# Patient Record
Sex: Female | Born: 1995
Health system: Southern US, Community
[De-identification: ages and names within clinical notes are randomized; demographics above are authoritative.]

## PROBLEM LIST (undated history)

## (undated) DIAGNOSIS — T7840XA Allergy, unspecified, initial encounter: Secondary | ICD-10-CM

## (undated) DIAGNOSIS — N200 Calculus of kidney: Secondary | ICD-10-CM

## (undated) DIAGNOSIS — Z8489 Family history of other specified conditions: Secondary | ICD-10-CM

## (undated) DIAGNOSIS — N159 Renal tubulo-interstitial disease, unspecified: Secondary | ICD-10-CM

## (undated) DIAGNOSIS — K219 Gastro-esophageal reflux disease without esophagitis: Secondary | ICD-10-CM

## (undated) HISTORY — DX: Allergy, unspecified, initial encounter: T78.40XA

## (undated) HISTORY — PX: TONSILLECTOMY: SUR1361

---

## 2016-10-09 DIAGNOSIS — R000 Tachycardia, unspecified: Secondary | ICD-10-CM | POA: Insufficient documentation

## 2016-10-09 DIAGNOSIS — R079 Chest pain, unspecified: Secondary | ICD-10-CM | POA: Insufficient documentation

## 2016-10-09 DIAGNOSIS — R0789 Other chest pain: Secondary | ICD-10-CM | POA: Insufficient documentation

## 2016-10-13 ENCOUNTER — Ambulatory Visit (INDEPENDENT_AMBULATORY_CARE_PROVIDER_SITE_OTHER): Payer: Managed Care, Other (non HMO)

## 2016-10-13 DIAGNOSIS — R079 Chest pain, unspecified: Secondary | ICD-10-CM

## 2016-10-13 DIAGNOSIS — R000 Tachycardia, unspecified: Secondary | ICD-10-CM

## 2016-11-12 DIAGNOSIS — R009 Unspecified abnormalities of heart beat: Secondary | ICD-10-CM | POA: Insufficient documentation

## 2017-01-30 ENCOUNTER — Encounter: Payer: Self-pay | Admitting: Family Medicine

## 2017-01-30 ENCOUNTER — Ambulatory Visit (INDEPENDENT_AMBULATORY_CARE_PROVIDER_SITE_OTHER): Payer: Managed Care, Other (non HMO) | Admitting: Family Medicine

## 2017-01-30 VITALS — BP 111/68 | HR 97 | Temp 98.2°F | Ht 63.5 in | Wt 157.0 lb

## 2017-01-30 DIAGNOSIS — H65113 Acute and subacute allergic otitis media (mucoid) (sanguinous) (serous), bilateral: Secondary | ICD-10-CM

## 2017-01-30 MED ORDER — FLUTICASONE PROPIONATE 50 MCG/ACT NA SUSP: 1.0000 | Freq: Two times a day (BID) | NASAL | 6 refills | Status: DC | PRN

## 2017-01-30 NOTE — Progress Notes (Signed)
BP 111/68 (BP Location: Left Arm, Patient Position: Sitting, Cuff Size: Normal)   Pulse 97   Temp 98.2 F (36.8 C) (Oral)   Ht 5' 3.5" (1.613 m)   Wt 157 lb (71.2 kg)   LMP 01/27/2017   BMI 27.38 kg/m    Subjective:    Patient ID: Jeanne Frederick, female    DOB: [birth year], 21 y.o.   MRN: [id number]  HPI: Jeanne Frederick is a 21 y.o. female presenting on 01/30/2017 for Establish Care and Otalgia (started yesterday, bilateral)   HPI Patient is coming in to establish care and for ear pain Patient has bilateral ear pain is worse on the left than the right. She has been having this ear pain for the last day. She has had this previously when it gets really bad and it gets infected and she is trying to catch it before it becomes infected this time. She does admit that she gets seasonal allergies especially to grass and thinks that may be starting up. She has not used anything for it yet. She denies any fevers or chills or cough or congestion. She does have some nasal congestion and drainage but it is minimal at this point. She denies any sick contacts that she knows of.  Relevant past medical, surgical, family and social history reviewed and updated as indicated. Interim medical history since our last visit reviewed. Allergies and medications reviewed and updated.  Review of Systems  Constitutional: Negative for chills and fever.  HENT: Positive for congestion and ear pain. Negative for ear discharge, rhinorrhea, sinus pain, sinus pressure, sneezing and sore throat.   Eyes: Negative for redness and visual disturbance.  Respiratory: Negative for cough, chest tightness and shortness of breath.   Cardiovascular: Negative for chest pain and leg swelling.  Musculoskeletal: Negative for back pain and gait problem.  Skin: Negative for rash.  Neurological: Negative for light-headedness and headaches.  All other systems reviewed and are negative.   Per HPI unless specifically indicated  above  Social History   Social History  . Marital status: Married    Spouse name: N/A  . Number of children: N/A  . Years of education: N/A   Occupational History  . Not on file.   Social History Main Topics  . Smoking status: Never Smoker  . Smokeless tobacco: Never Used  . Alcohol use No  . Drug use: No  . Sexual activity: Yes     Comment: married 06/2016   Other Topics Concern  . Not on file   Social History Narrative  . No narrative on file    History reviewed. No pertinent surgical history.  Family History  Problem Relation Age of Onset  . Diabetes Maternal Grandmother     Allergies as of 01/30/2017   No Known Allergies     Medication List       Accurate as of 01/30/17  3:54 PM. Always use your most recent med list.          CRYSELLE-28 0.3-30 MG-MCG tablet Generic drug:  norgestrel-ethinyl estradiol   fluticasone 50 MCG/ACT nasal spray Commonly known as:  FLONASE Place 1 spray into both nostrils 2 (two) times daily as needed for allergies or rhinitis.          Objective:    BP 111/68 (BP Location: Left Arm, Patient Position: Sitting, Cuff Size: Normal)   Pulse 97   Temp 98.2 F (36.8 C) (Oral)   Ht 5' 3.5" (1.613 m)   Wt 157  lb (71.2 kg)   LMP 01/27/2017   BMI 27.38 kg/m   Wt Readings from Last 3 Encounters:  01/30/17 157 lb (71.2 kg)    Physical Exam  Constitutional: She is oriented to person, place, and time. She appears well-developed and well-nourished. No distress.  HENT:  Right Ear: External ear and ear canal normal. No mastoid tenderness. Tympanic membrane is bulging. Tympanic membrane is not injected, not scarred, not perforated and not erythematous.  Left Ear: External ear and ear canal normal. No mastoid tenderness. Tympanic membrane is bulging. Tympanic membrane is not injected, not scarred, not perforated and not erythematous.  Nose: Mucosal edema and rhinorrhea present. No epistaxis. Right sinus exhibits no maxillary sinus  tenderness and no frontal sinus tenderness. Left sinus exhibits no maxillary sinus tenderness and no frontal sinus tenderness.  Mouth/Throat: Uvula is midline and mucous membranes are normal. No oropharyngeal exudate, posterior oropharyngeal edema, posterior oropharyngeal erythema or tonsillar abscesses.  Eyes: Conjunctivae and EOM are normal.  Cardiovascular: Normal rate, regular rhythm, normal heart sounds and intact distal pulses.   No murmur heard. Pulmonary/Chest: Effort normal and breath sounds normal. No respiratory distress. She has no wheezes.  Musculoskeletal: Normal range of motion. She exhibits no edema or tenderness.  Neurological: She is alert and oriented to person, place, and time. Coordination normal.  Skin: Skin is warm and dry. No rash noted. She is not diaphoretic.  Psychiatric: She has a normal mood and affect. Her behavior is normal.  Nursing note and vitals reviewed.   No results found for this or any previous visit.    Assessment & Plan:   Problem List Items Addressed This Visit    None    Visit Diagnoses    Acute allergic otitis media of both ears, recurrence not specified    -  Primary   Relevant Medications   fluticasone (FLONASE) 50 MCG/ACT nasal spray       Follow up plan: Return if symptoms worsen or fail to improve.  Caryl Pina, MD [facility] 01/30/2017, 3:54 PM

## 2017-05-27 ENCOUNTER — Telehealth: Payer: Self-pay | Admitting: Family Medicine

## 2017-05-27 NOTE — Telephone Encounter (Signed)
No shot records in [place] and none in [place]. Will check with Raynelle Highland to see if she has paper chart

## 2017-05-27 NOTE — Telephone Encounter (Signed)
Called pt to let her know that we do not have shot records and she is going to call dr where she had her shot records.

## 2017-09-08 DIAGNOSIS — H43392 Other vitreous opacities, left eye: Secondary | ICD-10-CM | POA: Diagnosis not present

## 2017-09-08 DIAGNOSIS — H35062 Retinal vasculitis, left eye: Secondary | ICD-10-CM | POA: Diagnosis not present

## 2017-09-24 ENCOUNTER — Encounter: Payer: Self-pay | Admitting: Family Medicine

## 2017-09-24 ENCOUNTER — Ambulatory Visit: Payer: BLUE CROSS/BLUE SHIELD | Admitting: Family Medicine

## 2017-09-24 VITALS — BP 125/76 | HR 113 | Temp 97.8°F | Ht 63.5 in | Wt 160.0 lb

## 2017-09-24 DIAGNOSIS — M779 Enthesopathy, unspecified: Principal | ICD-10-CM

## 2017-09-24 DIAGNOSIS — T782XXA Anaphylactic shock, unspecified, initial encounter: Secondary | ICD-10-CM | POA: Diagnosis not present

## 2017-09-24 DIAGNOSIS — M778 Other enthesopathies, not elsewhere classified: Secondary | ICD-10-CM | POA: Diagnosis not present

## 2017-09-24 NOTE — Progress Notes (Signed)
BP 125/76   Pulse (!) 113   Temp 97.8 F (36.6 C) (Oral)   Ht 5' 3.5" (1.613 m)   Wt 160 lb (72.6 kg)   BMI 27.90 kg/m    Subjective:    Patient ID: Jeanne Frederick, female    DOB: 1996-03-09, 21 y.o.   MRN: [id number]  HPI: Jeanne Frederick is a 21 y.o. female presenting on 09/24/2017 for Right hand pain (hurts in wrist area and on top of hand when lifting; no known injury) and Lips feel as if they are swelling after having dairy product (Alpha Gal Allergy)   HPI Right hand pain Patient is coming in with right hand pain that hurts on the back of her right hand that extends up to inside of her middle finger that has been going on for about a week.  She says it hurts more when she lifts things with that hand or if she extends the hand.  She denies any numbness or weakness with it.  She denies any inciting trauma or event.  She just says it caused a lot of pain and irritation.  She denies any redness or warmth or swelling that she is noticed.  Swelling of lips and mouth and anaphylaxis Patient had an episode a couple days ago of swelling of her lips and mouth after drinking milk and she is concerned about whether she may have developed a milk allergy.  Patient has known alpha galactose allergy due to a tick bite.  She also had some hives around her face and mouth when this came.  She took some Benadryl.  She does have an EpiPen that she carries but just in case because she has had severe reactions to the alpha gal before.  She denies any current symptoms today.  Relevant past medical, surgical, family and social history reviewed and updated as indicated. Interim medical history since our last visit reviewed. Allergies and medications reviewed and updated.  Review of Systems  Constitutional: Negative for chills and fever.  HENT: Negative for congestion, ear discharge and ear pain.   Eyes: Negative for redness and visual disturbance.  Respiratory: Negative for chest tightness and  shortness of breath.   Cardiovascular: Negative for chest pain and leg swelling.  Musculoskeletal: Positive for arthralgias. Negative for back pain, gait problem and joint swelling.  Skin: Negative for color change and rash.  Neurological: Negative for light-headedness and headaches.  Psychiatric/Behavioral: Negative for agitation and behavioral problems.  All other systems reviewed and are negative.   Per HPI unless specifically indicated above   Allergies as of 09/24/2017      Reactions   Beef-derived Products    Alpha Gal Allergy   Pork-derived Products    Alpha Gal Allergy      Medication List        Accurate as of 09/24/17  3:31 PM. Always use your most recent med list.          CRYSELLE-28 0.3-30 MG-MCG tablet Generic drug:  norgestrel-ethinyl estradiol          Objective:    BP 125/76   Pulse (!) 113   Temp 97.8 F (36.6 C) (Oral)   Ht 5' 3.5" (1.613 m)   Wt 160 lb (72.6 kg)   BMI 27.90 kg/m   Wt Readings from Last 3 Encounters:  09/24/17 160 lb (72.6 kg)  01/30/17 157 lb (71.2 kg)    Physical Exam  Constitutional: She is oriented to person, place, and time. She appears  well-developed and well-nourished. No distress.  Eyes: Conjunctivae are normal.  Neck: Neck supple. No thyromegaly present.  Cardiovascular: Normal rate, regular rhythm, normal heart sounds and intact distal pulses.  No murmur heard. Pulmonary/Chest: Effort normal and breath sounds normal. No respiratory distress. She has no wheezes.  Musculoskeletal: Normal range of motion. She exhibits no edema.       Right hand: She exhibits tenderness. She exhibits normal range of motion and normal capillary refill.       Hands: Lymphadenopathy:    She has no cervical adenopathy.  Neurological: She is alert and oriented to person, place, and time. Coordination normal.  Skin: Skin is warm and dry. No rash noted. She is not diaphoretic.  Psychiatric: She has a normal mood and affect. Her behavior  is normal.  Nursing note and vitals reviewed.   No results found for this or any previous visit.    Assessment & Plan:   Problem List Items Addressed This Visit    None    Visit Diagnoses    Tendinitis of extensor tendon of right hand    -  Primary   Recommended Aleve twice a day for 7 days and then return or call back if not improved   Anaphylaxis, initial encounter       Patient had an episode of anaphylaxis to milk and wants to be tested for allergy testing.  She has known alpha gal but is concerned about the milk   Relevant Orders   Allergen Profile, Basic Food       Follow up plan: Return if symptoms worsen or fail to improve.  Counseling provided for all of the vaccine components Orders Placed This Encounter  Procedures  . Allergen Profile, [place]er, MD [facility] 09/24/2017, 3:31 PM

## 2017-09-28 LAB — ALLERGEN PROFILE, BASIC FOOD
Allergen Corn, IgE: <0.1 kU/L
Beef IgE: 1.04 kU/L — AB
Chocolate/Cacao IgE: <0.1 kU/L
Egg, Whole IgE: <0.1 kU/L
Food Mix (Seafoods) IgE: NEGATIVE
Milk IgE: 0.26 kU/L — AB
Peanut IgE: <0.1 kU/L
Pork IgE: 0.58 kU/L — AB
Soybean IgE: <0.1 kU/L
Wheat IgE: <0.1 kU/L

## 2017-11-23 ENCOUNTER — Encounter: Payer: Self-pay | Admitting: Family Medicine

## 2017-11-23 ENCOUNTER — Ambulatory Visit: Payer: BLUE CROSS/BLUE SHIELD | Admitting: Family Medicine

## 2017-11-23 VITALS — BP 126/77 | HR 97 | Temp 97.9°F | Ht 63.5 in | Wt 163.4 lb

## 2017-11-23 DIAGNOSIS — J069 Acute upper respiratory infection, unspecified: Secondary | ICD-10-CM | POA: Diagnosis not present

## 2017-11-23 LAB — VERITOR FLU A/B WAIVED
Influenza A: NEGATIVE
Influenza B: NEGATIVE

## 2017-11-23 NOTE — Progress Notes (Signed)
BP 126/77   Pulse 97   Temp 97.9 F (36.6 C) (Oral)   Ht 5' 3.5" (1.613 m)   Wt 163 lb 6.4 oz (74.1 kg)   BMI 28.49 kg/m    Subjective:    Patient ID: Jeanne Frederick, female    DOB: [birth year], 22 y.o.   MRN: [id number]  HPI: Jeanne Frederick is a 22 y.o. female presenting on 11/23/2017 for Cough (x 2 days); Nasal Congestion; Sore Throat; and Generalized Body Aches   HPI Sore throat nasal congestion and body aches Patient comes in today complaining of sore throat and nasal congestion and body aches is been going on for the past 2 days.  She had had a cough previous to that for the past week but it really started up 2 days ago.  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  She has had a productive cough and a sore throat as well.  Relevant past medical, surgical, family and social history reviewed and updated as indicated. Interim medical history since our last visit reviewed. Allergies and medications reviewed and updated.  Review of Systems  Constitutional: Positive for chills and fever.  HENT: Positive for congestion, postnasal drip, rhinorrhea, sinus pressure, sneezing and sore throat. Negative for ear discharge and ear pain.   Eyes: Negative for pain, redness and visual disturbance.  Respiratory: Positive for cough. Negative for chest tightness, shortness of breath and wheezing.   Cardiovascular: Negative for chest pain and leg swelling.  Genitourinary: Negative for difficulty urinating and dysuria.  Musculoskeletal: Positive for myalgias. Negative for back pain and gait problem.  Skin: Negative for rash.  Neurological: Negative for light-headedness and headaches.  Psychiatric/Behavioral: Negative for agitation and behavioral problems.  All other systems reviewed and are negative.   Per HPI  unless specifically indicated above   Allergies as of 11/23/2017      Reactions   Beef-derived Products    Alpha Gal Allergy   Pork-derived Products    Alpha Gal Allergy      Medication List        Accurate as of 11/23/17  2:31 PM. Always use your most recent med list.          CRYSELLE-28 0.3-30 MG-MCG tablet Generic drug:  norgestrel-ethinyl estradiol          Objective:    BP 126/77   Pulse 97   Temp 97.9 F (36.6 C) (Oral)   Ht 5' 3.5" (1.613 m)   Wt 163 lb 6.4 oz (74.1 kg)   BMI 28.49 kg/m   Wt Readings from Last 3 Encounters:  11/23/17 163 lb 6.4 oz (74.1 kg)  09/24/17 160 lb (72.6 kg)  01/30/17 157 lb (71.2 kg)    Physical Exam  Constitutional: She is oriented to person, place, and time. She appears well-developed and well-nourished. No distress.  HENT:  Right Ear: Tympanic membrane, external ear and ear canal normal.  Left Ear: Tympanic membrane, external ear and ear canal normal.  Nose: Mucosal edema and rhinorrhea present. No epistaxis. Right sinus exhibits no maxillary sinus tenderness and no frontal sinus tenderness. Left sinus exhibits no maxillary sinus tenderness and no frontal sinus tenderness.  Mouth/Throat: Uvula is midline and mucous membranes are normal. Posterior oropharyngeal edema and posterior oropharyngeal erythema present. No  oropharyngeal exudate or tonsillar abscesses.  Eyes: Conjunctivae are normal.  Cardiovascular: Normal rate, regular rhythm, normal heart sounds and intact distal pulses.  No murmur heard. Pulmonary/Chest: Effort normal and breath sounds normal. No respiratory distress. She has no wheezes. She has no rales.  Musculoskeletal: Normal range of motion. She exhibits no edema or tenderness.  Neurological: She is alert and oriented to person, place, and time. Coordination normal.  Skin: Skin is warm and dry. No rash noted. She is not diaphoretic.  Psychiatric: She has a normal mood and affect. Her behavior is normal.    Nursing note and vitals reviewed.       Assessment & Plan:   Problem List Items Addressed This Visit    None    Visit Diagnoses    Viral upper respiratory infection    -  Primary   Recommended Flonase and Mucinex and nasal saline and ibuprofen   Relevant Orders   Veritor Flu A/B Waived     Sounds like patient is on the improvement side as she has not had fevers and at least 24 hours.  Recommend conservative management and if worsens she will give [place] call back.  Follow up plan: Return if symptoms worsen or fail to improve.  Counseling provided for all of the vaccine components Orders Placed This Encounter  Procedures  . Veritor Flu A/B Ulyses Southward, MD [facility] 11/23/2017, 2:31 PM

## 2017-12-11 DIAGNOSIS — Z6828 Body mass index (BMI) 28.0-28.9, adult: Secondary | ICD-10-CM | POA: Diagnosis not present

## 2017-12-11 DIAGNOSIS — Z01419 Encounter for gynecological examination (general) (routine) without abnormal findings: Secondary | ICD-10-CM | POA: Diagnosis not present

## 2017-12-28 DIAGNOSIS — J3501 Chronic tonsillitis: Secondary | ICD-10-CM | POA: Insufficient documentation

## 2017-12-28 DIAGNOSIS — J302 Other seasonal allergic rhinitis: Secondary | ICD-10-CM | POA: Insufficient documentation

## 2018-01-04 DIAGNOSIS — N941 Unspecified dyspareunia: Secondary | ICD-10-CM | POA: Diagnosis not present

## 2018-01-04 DIAGNOSIS — Z6829 Body mass index (BMI) 29.0-29.9, adult: Secondary | ICD-10-CM | POA: Diagnosis not present

## 2018-01-04 DIAGNOSIS — R102 Pelvic and perineal pain: Secondary | ICD-10-CM | POA: Diagnosis not present

## 2018-02-21 ENCOUNTER — Emergency Department (HOSPITAL_COMMUNITY): Payer: BLUE CROSS/BLUE SHIELD

## 2018-02-21 ENCOUNTER — Encounter (HOSPITAL_COMMUNITY): Payer: Self-pay | Admitting: Emergency Medicine

## 2018-02-21 ENCOUNTER — Other Ambulatory Visit: Payer: Self-pay

## 2018-02-21 ENCOUNTER — Inpatient Hospital Stay (HOSPITAL_COMMUNITY)
Admission: EM | Admit: 2018-02-21 | Discharge: 2018-02-23 | DRG: 694 | Disposition: A | Discharge status: Home / Self Care | Payer: BLUE CROSS/BLUE SHIELD | Attending: Internal Medicine | Admitting: Internal Medicine

## 2018-02-21 DIAGNOSIS — R1031 Right lower quadrant pain: Secondary | ICD-10-CM | POA: Diagnosis not present

## 2018-02-21 DIAGNOSIS — R1084 Generalized abdominal pain: Secondary | ICD-10-CM | POA: Diagnosis not present

## 2018-02-21 DIAGNOSIS — N202 Calculus of kidney with calculus of ureter: Secondary | ICD-10-CM | POA: Diagnosis not present

## 2018-02-21 DIAGNOSIS — E876 Hypokalemia: Secondary | ICD-10-CM

## 2018-02-21 DIAGNOSIS — Z91018 Allergy to other foods: Secondary | ICD-10-CM | POA: Diagnosis not present

## 2018-02-21 DIAGNOSIS — R1110 Vomiting, unspecified: Secondary | ICD-10-CM

## 2018-02-21 DIAGNOSIS — Z79899 Other long term (current) drug therapy: Secondary | ICD-10-CM

## 2018-02-21 DIAGNOSIS — N200 Calculus of kidney: Secondary | ICD-10-CM | POA: Diagnosis not present

## 2018-02-21 DIAGNOSIS — A419 Sepsis, unspecified organism: Secondary | ICD-10-CM | POA: Diagnosis present

## 2018-02-21 DIAGNOSIS — R112 Nausea with vomiting, unspecified: Secondary | ICD-10-CM | POA: Diagnosis not present

## 2018-02-21 DIAGNOSIS — R109 Unspecified abdominal pain: Secondary | ICD-10-CM | POA: Diagnosis not present

## 2018-02-21 DIAGNOSIS — N10 Acute tubulo-interstitial nephritis: Secondary | ICD-10-CM | POA: Diagnosis not present

## 2018-02-21 DIAGNOSIS — N201 Calculus of ureter: Secondary | ICD-10-CM | POA: Diagnosis not present

## 2018-02-21 DIAGNOSIS — R000 Tachycardia, unspecified: Secondary | ICD-10-CM | POA: Diagnosis not present

## 2018-02-21 LAB — COMPREHENSIVE METABOLIC PANEL
ALT: 16 U/L (ref 14–54)
AST: 22 U/L (ref 15–41)
Albumin: 4.1 g/dL (ref 3.5–5.0)
Alkaline Phosphatase: 39 U/L (ref 38–126)
Anion gap: 11 (ref 5–15)
BUN: 11 mg/dL (ref 6–20)
CO2: 22 mmol/L (ref 22–32)
Calcium: 9.2 mg/dL (ref 8.9–10.3)
Chloride: 112 mmol/L — ABNORMAL HIGH (ref 101–111)
Creatinine, Ser: 0.63 mg/dL (ref 0.44–1.00)
GFR calc Af Amer: >60 mL/min (ref >60)
GFR calc non Af Amer: >60 mL/min (ref >60)
Glucose, Bld: 96 mg/dL (ref 65–99)
Potassium: 3.5 mmol/L (ref 3.5–5.1)
Sodium: 145 mmol/L (ref 135–145)
Total Bilirubin: 0.7 mg/dL (ref 0.3–1.2)
Total Protein: 7.1 g/dL (ref 6.5–8.1)

## 2018-02-21 LAB — WET PREP, GENITAL
Clue Cells Wet Prep HPF POC: NONE SEEN
Sperm: NONE SEEN
Trich, Wet Prep: NONE SEEN
Yeast Wet Prep HPF POC: NONE SEEN

## 2018-02-21 LAB — CBC WITH DIFFERENTIAL/PLATELET
Basophils Absolute: 0 10*3/uL (ref 0.0–0.1)
Basophils Relative: 0 %
Eosinophils Absolute: 0 10*3/uL (ref 0.0–0.7)
Eosinophils Relative: 0 %
HCT: 35.7 % — ABNORMAL LOW (ref 36.0–46.0)
Hemoglobin: 12 g/dL (ref 12.0–15.0)
Lymphocytes Relative: 9 %
Lymphs Abs: 1.4 10*3/uL (ref 0.7–4.0)
MCH: 31.7 pg (ref 26.0–34.0)
MCHC: 33.6 g/dL (ref 30.0–36.0)
MCV: 94.4 fL (ref 78.0–100.0)
Monocytes Absolute: 0.6 10*3/uL (ref 0.1–1.0)
Monocytes Relative: 4 %
Neutro Abs: 12.9 10*3/uL — ABNORMAL HIGH (ref 1.7–7.7)
Neutrophils Relative %: 87 %
Platelets: 242 10*3/uL (ref 150–400)
RBC: 3.78 MIL/uL — ABNORMAL LOW (ref 3.87–5.11)
RDW: 12.5 % (ref 11.5–15.5)
WBC: 14.8 10*3/uL — ABNORMAL HIGH (ref 4.0–10.5)

## 2018-02-21 LAB — URINALYSIS, ROUTINE W REFLEX MICROSCOPIC
Bilirubin Urine: NEGATIVE
Glucose, UA: NEGATIVE mg/dL
Ketones, ur: 80 mg/dL — AB
Leukocytes, UA: NEGATIVE
Nitrite: NEGATIVE
Protein, ur: 100 mg/dL — AB
RBC / HPF: >50 RBC/hpf — ABNORMAL HIGH (ref 0–5)
Specific Gravity, Urine: 1.021 (ref 1.005–1.030)
pH: 8 (ref 5.0–8.0)

## 2018-02-21 LAB — I-STAT CG4 LACTIC ACID, ED: Lactic Acid, Venous: 0.99 mmol/L (ref 0.5–1.9)

## 2018-02-21 LAB — I-STAT BETA HCG BLOOD, ED (MC, WL, AP ONLY): I-stat hCG, quantitative: <5 m[IU]/mL (ref <5)

## 2018-02-21 LAB — LIPASE, BLOOD: Lipase: 24 U/L (ref 11–51)

## 2018-02-21 MED ORDER — ONDANSETRON HCL 4 MG PO TABS: 4.0000 mg | ORAL_TABLET | Freq: Four times a day (QID) | ORAL | Status: DC | PRN

## 2018-02-21 MED ORDER — MORPHINE SULFATE (PF) 4 MG/ML IV SOLN
4.0000 mg | Freq: Once | INTRAVENOUS | Status: AC
  Administered 2018-02-21: 4 mg via INTRAVENOUS
  Filled 2018-02-21: qty 1

## 2018-02-21 MED ORDER — DEXTROSE-NACL 5-0.45 % IV SOLN
INTRAVENOUS | Status: DC
  Administered 2018-02-21 – 2018-02-22 (×2): via INTRAVENOUS

## 2018-02-21 MED ORDER — MORPHINE SULFATE (PF) 4 MG/ML IV SOLN: 2.0000 mg | INTRAVENOUS | Status: DC | PRN

## 2018-02-21 MED ORDER — SODIUM CHLORIDE 0.9 % IV BOLUS
1000.0000 mL | Freq: Once | INTRAVENOUS | Status: AC
  Administered 2018-02-21: 1000 mL via INTRAVENOUS

## 2018-02-21 MED ORDER — IOPAMIDOL (ISOVUE-300) INJECTION 61%
100.0000 mL | Freq: Once | INTRAVENOUS | Status: AC | PRN
  Administered 2018-02-21: 100 mL via INTRAVENOUS

## 2018-02-21 MED ORDER — IOPAMIDOL (ISOVUE-300) INJECTION 61%
INTRAVENOUS | Status: AC
  Filled 2018-02-21: qty 100

## 2018-02-21 MED ORDER — ONDANSETRON HCL 4 MG/2ML IJ SOLN
4.0000 mg | Freq: Once | INTRAMUSCULAR | Status: AC
  Administered 2018-02-21: 4 mg via INTRAVENOUS
  Filled 2018-02-21: qty 2

## 2018-02-21 MED ORDER — FENTANYL CITRATE (PF) 100 MCG/2ML IJ SOLN
25.0000 ug | Freq: Once | INTRAMUSCULAR | Status: AC
  Administered 2018-02-21: 25 ug via INTRAVENOUS
  Filled 2018-02-21: qty 2

## 2018-02-21 MED ORDER — ACETAMINOPHEN 325 MG PO TABS
650.0000 mg | ORAL_TABLET | Freq: Four times a day (QID) | ORAL | Status: DC | PRN
  Administered 2018-02-21 – 2018-02-23 (×2): 650 mg via ORAL
  Filled 2018-02-21 (×2): qty 2

## 2018-02-21 MED ORDER — SODIUM CHLORIDE 0.9 % IV SOLN
2.0000 g | INTRAVENOUS | Status: DC
  Administered 2018-02-21: 2 g via INTRAVENOUS
  Filled 2018-02-21: qty 20

## 2018-02-21 MED ORDER — ACETAMINOPHEN 650 MG RE SUPP: 650.0000 mg | Freq: Four times a day (QID) | RECTAL | Status: DC | PRN

## 2018-02-21 MED ORDER — SODIUM CHLORIDE 0.9 % IV BOLUS (SEPSIS)
1500.0000 mL | Freq: Once | INTRAVENOUS | Status: AC
  Administered 2018-02-21: 1500 mL via INTRAVENOUS

## 2018-02-21 MED ORDER — ONDANSETRON HCL 4 MG/2ML IJ SOLN
4.0000 mg | Freq: Four times a day (QID) | INTRAMUSCULAR | Status: DC | PRN
  Administered 2018-02-21 – 2018-02-22 (×2): 4 mg via INTRAVENOUS
  Filled 2018-02-21 (×2): qty 2

## 2018-02-21 MED ORDER — MORPHINE SULFATE (PF) 2 MG/ML IV SOLN: 2.0000 mg | INTRAVENOUS | Status: DC | PRN

## 2018-02-21 NOTE — ED Notes (Signed)
Pt vomited before vitals.

## 2018-02-21 NOTE — ED Notes (Signed)
Bed: WA03 Expected date:  Expected time:  Means of arrival:  Comments: T3

## 2018-02-21 NOTE — ED Notes (Signed)
Pelvic cart at bedisde

## 2018-02-21 NOTE — Consult Note (Signed)
Urology Consult Note   Requesting Attending Physician:  Etta Quill, DO Service Providing Consult: Urology  Consulting Attending: Lovena Neighbours   Reason for Consult:  Right flank pain  HPI: Jeanne Frederick is seen in consultation for reasons noted above at the request of Etta Quill, DO for evaluation of Right flank pain.  This is a 22 y.o. female with right flank pain.   Started to have abdm discomfort and cramping yesterday. Pain became worse today and localized to the right side. She had associated nausea and vomiting x 4. No prior hx of kidney stones.  In the ED, Afebrile and tachy to the low 100s WBC - 14.8 Cr 0.63 U/A - Negative LE, nitrites, rare bacteria, >50 rbc, 0-5 WBC  CT showed mildly delayed right nephrogram with 33mm calcification in the region of the mid ureter,  stone vs phlebolith   Past Medical History: Past Medical History:  Diagnosis Date  . Allergy     Past Surgical History:  History reviewed. No pertinent surgical history.  Medication: Current Facility-Administered Medications  Medication Dose Route Frequency Provider Last Rate Last Dose  . acetaminophen (TYLENOL) tablet 650 mg  650 mg Oral Q6H PRN Etta Quill, DO       Or  . acetaminophen (TYLENOL) suppository 650 mg  650 mg Rectal Q6H PRN Etta Quill, DO      . cefTRIAXone (ROCEPHIN) 2 g in sodium chloride 0.9 % 100 mL IVPB  2 g Intravenous Q24H Lorin Glass, PA-C   Stopped at 02/21/18 2016  . dextrose 5 %-0.45 % sodium chloride infusion   Intravenous Continuous Etta Quill, DO 100 mL/hr at 02/21/18 1949    . iopamidol (ISOVUE-300) 61 % injection        Stopped at 02/21/18 1806  . morphine 2 MG/ML injection 2-4 mg  2-4 mg Intravenous Q4H PRN Etta Quill, DO      . ondansetron [facility]) tablet 4 mg  4 mg Oral Q6H PRN Etta Quill, DO       Or  . ondansetron [facility]) injection 4 mg  4 mg Intravenous Q6H PRN Etta Quill, DO       Current Outpatient Medications   Medication Sig Dispense Refill  . norethindrone-ethinyl estradiol (JUNEL FE,GILDESS FE,LOESTRIN FE) 1-20 MG-MCG tablet Take 1 tablet by mouth daily.    Marland Kitchen EPINEPHrine (EPIPEN 2-PAK) 0.3 mg/0.3 mL IJ SOAJ injection Inject 1 pen as directed once as needed for anaphylaxis.      Allergies: Allergies  Allergen Reactions  . Lac Bovis Swelling and Other (See Comments)  . Beef-Derived Products     Alpha Gal Allergy   . Other Other (See Comments)    Cannot take capsules of any kind d/t alpha gal disease  . Pork-Derived Risk manager Gal Allergy    Social History: Social History   Tobacco Use  . Smoking status: Never Smoker  . Smokeless tobacco: Never Used  Substance Use Topics  . Alcohol use: No  . Drug use: No    Family History Family History  Problem Relation Age of Onset  . Diabetes Maternal Grandmother     Review of Systems 10 systems were reviewed and are negative except as noted specifically in the HPI.  Objective   Vital signs in last 24 hours: BP 130/87   Pulse (!) 118   Temp 98.4 F (36.9 C) (Oral)   Resp (!) 26   Ht 5\' 3"  (1.6 m)  Wt 70.3 kg (155 lb)   SpO2 98%   BMI 27.46 kg/m   Physical Exam General: NAD, A&O, resting, appropriate HEENT: [place]/AT, EOMI, MMM Pulmonary: Normal work of breathing Cardiovascular: HDS, adequate peripheral perfusion Abdomen: Soft, NTTP, nondistended, . GU: right CVA tenderness Extremities: warm and well perfused Neuro: Appropriate, no focal neurological deficits  Most Recent Labs: Lab Results  Component Value Date   WBC 14.8 (H) 02/21/2018   HGB 12.0 02/21/2018   HCT 35.7 (L) 02/21/2018   PLT 242 02/21/2018    Lab Results  Component Value Date   NA 145 02/21/2018   K 3.5 02/21/2018   CL 112 (H) 02/21/2018   CO2 22 02/21/2018   BUN 11 02/21/2018   CREATININE 0.63 02/21/2018   CALCIUM 9.2 02/21/2018    No results found for: INR, APTT   IMAGING: Ct Abdomen Pelvis W Contrast  Result Date:  02/21/2018 CLINICAL DATA:  Abdominal discomfort since this morning. Intermittent cramping after changing birth control pills 2 months ago. Rectal pain radiating to the back. EXAM: CT ABDOMEN AND PELVIS WITH CONTRAST TECHNIQUE: Multidetector CT imaging of the abdomen and pelvis was performed using the standard protocol following bolus administration of intravenous contrast. CONTRAST:  138mL ISOVUE-300 IOPAMIDOL (ISOVUE-300) INJECTION 61% COMPARISON:  None. FINDINGS: LOWER CHEST: Mild dependent atelectasis clear. Included heart size is normal. No pericardial effusion. HEPATOBILIARY: Liver and gallbladder are normal. PANCREAS: Normal. SPLEEN: Normal. ADRENALS/URINARY TRACT: Kidneys are orthotopic. Mildly delayed and striated RIGHT nephrogram with RIGHT urothelial enhancement. RIGHT pelviectasis. No hydronephrosis. 2 mm calculus within or adjacent to distal RIGHT ureter. Urinary bladder is partially distended, unremarkable. Punctate LEFT lower pole nephrolithiasis. STOMACH/BOWEL: The stomach, small and large bowel are normal in course and caliber without inflammatory changes. Normal appendix. VASCULAR/LYMPHATIC: Aortoiliac vessels are normal in course and caliber. No lymphadenopathy by CT size criteria. REPRODUCTIVE: Normal. OTHER: No intraperitoneal free fluid or free air. MUSCULOSKELETAL: Nonacute.  Small fat containing umbilical hernia. IMPRESSION: 1. RIGHT pyelonephritis. 2 mm distal RIGHT ureteral calculus versus phlebolith. RIGHT pelviectasis. 2. Punctate nonobstructing LEFT lower pole nephrolithiasis. Electronically Signed   By: Elon Alas M.D.   On: 02/21/2018 18:44    ------  Assessment:  22 y.o. female with right flank pain and tachycardia. CT with 1mm calcification in the mid right ureter, concerning for stone vs phlebolith.   Difficult to determine whether calcification is in the ureter or represents a phlebolith. Coronal views are suspicious for a ureteral stone; however, in axial views this  is difficult to confirm. At this point we will assume the calcification is in the ureter.   The patient is afebrile and her vitals are stable. U/a is relatively benign. Recommend abx and close monitoring. If patient develops a fever she will require right ureteral stent placement. Otherwise, we will follow up with her as an outpatient in 2 weeks with a renal ultrasound.   Recommendations: - Start flomax 0.4mg   - Follow up urine culture - If patient develops fever, please contact urology for stent placement  - Urology will coordinate outpatient follow up in 2 weeks w/ RUS prior.                                                    Thank you for this consult. Please contact the urology consult pager with any further questions/concerns.

## 2018-02-21 NOTE — ED Triage Notes (Signed)
Per EMS, from work, c/o lower abdominal discomfort since waking this morning. Reports ongoing intermittent lower abdominal cramping since birth control pills were changed two months ago. Denies urinary sx. N/V with EMS.   22g L hand 50 mcg Fentanyl with EMS

## 2018-02-21 NOTE — ED Notes (Signed)
Urine culture sent with sample. 

## 2018-02-21 NOTE — ED Notes (Signed)
ED TO INPATIENT HANDOFF REPORT  Name/Age/Gender Jeanne Frederick 22 y.o. female  Code Status    Code Status Orders  (From admission, onward)        Start     Ordered   02/21/18 1953  Full code  Continuous     02/21/18 1956    Code Status History    This patient has a current code status but no historical code status.      Home/SNF/Other Home  Chief Complaint abd pain  Level of Care/Admitting Diagnosis ED Disposition    ED Disposition Condition [facility] Area: [place] [100102]  Level of Care: Telemetry [5]  Admit to tele based on following criteria: Complex arrhythmia (Bradycardia/Tachycardia)  Diagnosis: Acute pyelonephritis [017510]  Admitting Physician: Doreatha Massed  Attending Physician: Etta Quill [phone]  Estimated length of stay: past midnight tomorrow  Certification:: I certify this patient will need inpatient services for at least 2 midnights  PT Class (Do Not Modify): Inpatient [101]  PT Acc Code (Do Not Modify): Private [1]       Medical History Past Medical History:  Diagnosis Date  . Allergy     Allergies Allergies  Allergen Reactions  . Lac Bovis Swelling and Other (See Comments)  . Beef-Derived Products     Alpha Gal Allergy   . Other Other (See Comments)    Cannot take capsules of any kind d/t alpha gal disease  . Pork-Derived Risk manager Gal Allergy    IV Location/Drains/Wounds Patient Lines/Drains/Airways Status   Active Line/Drains/Airways    Name:   Placement date:   Placement time:   Site:   Days:   Peripheral IV 02/21/18 Left Hand   02/21/18    1622    Hand   less than 1   Peripheral IV 02/21/18 Left Antecubital   02/21/18    1939    Antecubital   less than 1          Labs/Imaging Results for orders placed or performed during the hospital encounter of 02/21/18 (from the past 48 hour(s))  Comprehensive metabolic panel     Status: Abnormal   Collection Time:  02/21/18  3:49 PM  Result Value Ref Range   Sodium 145 135 - 145 mmol/L   Potassium 3.5 3.5 - 5.1 mmol/L   Chloride 112 (H) 101 - 111 mmol/L   CO2 22 22 - 32 mmol/L   Glucose, Bld 96 65 - 99 mg/dL   BUN 11 6 - 20 mg/dL   Creatinine, Ser 0.63 0.44 - 1.00 mg/dL   Calcium 9.2 8.9 - 10.3 mg/dL   Total Protein 7.1 6.5 - 8.1 g/dL   Albumin 4.1 3.5 - 5.0 g/dL   AST 22 15 - 41 U/L   ALT 16 14 - 54 U/L   Alkaline Phosphatase 39 38 - 126 U/L   Total Bilirubin 0.7 0.3 - 1.2 mg/dL   GFR calc non Af Amer >60 >60 mL/min   GFR calc Af Amer >60 >60 mL/min    Comment: (NOTE) The eGFR has been calculated using the CKD EPI equation. This calculation has not been validated in all clinical situations. eGFR's persistently <60 mL/min signify possible Chronic Kidney Disease.    Anion gap 11 5 - 15    Comment: Performed at [facility], [place] [address], [place], [place] [address]  Lipase, blood     Status: None   Collection  Time: 02/21/18  3:49 PM  Result Value Ref Range   Lipase 24 11 - 51 U/L    Comment: Performed at [facility], [place] [address], [place], [place] [address]  CBC with Differential     Status: Abnormal   Collection Time: 02/21/18  3:49 PM  Result Value Ref Range   WBC 14.8 (H) 4.0 - 10.5 K/uL   RBC 3.78 (L) 3.87 - 5.11 MIL/uL   Hemoglobin 12.0 12.0 - 15.0 g/dL   HCT 35.7 (L) 36.0 - 46.0 %   MCV 94.4 78.0 - 100.0 fL   MCH 31.7 26.0 - 34.0 pg   MCHC 33.6 30.0 - 36.0 g/dL   RDW 12.5 11.5 - 15.5 %   Platelets 242 150 - 400 K/uL   Neutrophils Relative % 87 %   Neutro Abs 12.9 (H) 1.7 - 7.7 K/uL   Lymphocytes Relative 9 %   Lymphs Abs 1.4 0.7 - 4.0 K/uL   Monocytes Relative 4 %   Monocytes Absolute 0.6 0.1 - 1.0 K/uL   Eosinophils Relative 0 %   Eosinophils Absolute 0.0 0.0 - 0.7 K/uL   Basophils Relative 0 %   Basophils Absolute 0.0 0.0 - 0.1 K/uL    Comment: Performed at [facility], [place] [address], [place], [place]  [address]  I-Stat beta hCG blood, ED     Status: None   Collection Time: 02/21/18  4:33 PM  Result Value Ref Range   I-stat hCG, quantitative <5.0 <5 mIU/mL   Comment 3            Comment:   GEST. AGE      CONC.  (mIU/mL)   <=1 WEEK        5 - 50     2 WEEKS       50 - 500     3 WEEKS       100 - 10,000     4 WEEKS     1,000 - 30,000        FEMALE AND NON-PREGNANT FEMALE:     LESS THAN 5 mIU/mL   Urinalysis, Routine w reflex microscopic     Status: Abnormal   Collection Time: 02/21/18  5:30 PM  Result Value Ref Range   Color, Urine YELLOW YELLOW   APPearance HAZY (A) CLEAR   Specific Gravity, Urine 1.021 1.005 - 1.030   pH 8.0 5.0 - 8.0   Glucose, UA NEGATIVE NEGATIVE mg/dL   Hgb urine dipstick LARGE (A) NEGATIVE   Bilirubin Urine NEGATIVE NEGATIVE   Ketones, ur 80 (A) NEGATIVE mg/dL   Protein, ur 100 (A) NEGATIVE mg/dL   Nitrite NEGATIVE NEGATIVE   Leukocytes, UA NEGATIVE NEGATIVE   RBC / HPF >50 (H) 0 - 5 RBC/hpf   WBC, UA 0-5 0 - 5 WBC/hpf   Bacteria, UA RARE (A) NONE SEEN   Squamous Epithelial / LPF 0-5 0 - 5   Mucus PRESENT    Non Squamous Epithelial 0-5 (A) NONE SEEN    Comment: Performed at [facility], [place] [address], [place],  [address]  Wet prep, genital     Status: Abnormal   Collection Time: 02/21/18  5:56 PM  Result Value Ref Range   Yeast Wet Prep HPF POC NONE SEEN NONE SEEN   Trich, Wet Prep NONE SEEN NONE SEEN   Clue Cells Wet Prep HPF POC NONE SEEN NONE SEEN   WBC, Wet Prep HPF POC MANY (A)  NONE SEEN   Sperm NONE SEEN     Comment: Performed at [facility], [place] [address], [place], [place] [address]  I-Stat CG4 Lactic Acid, ED  (not at  [facility])     Status: None   Collection Time: 02/21/18  7:32 PM  Result Value Ref Range   Lactic Acid, Venous 0.99 0.5 - 1.9 mmol/L   Ct Abdomen Pelvis W Contrast  Result Date: 02/21/2018 CLINICAL DATA:  Abdominal discomfort since this morning. Intermittent cramping after changing  birth control pills 2 months ago. Rectal pain radiating to the back. EXAM: CT ABDOMEN AND PELVIS WITH CONTRAST TECHNIQUE: Multidetector CT imaging of the abdomen and pelvis was performed using the standard protocol following bolus administration of intravenous contrast. CONTRAST:  187m ISOVUE-300 IOPAMIDOL (ISOVUE-300) INJECTION 61% COMPARISON:  None. FINDINGS: LOWER CHEST: Mild dependent atelectasis clear. Included heart size is normal. No pericardial effusion. HEPATOBILIARY: Liver and gallbladder are normal. PANCREAS: Normal. SPLEEN: Normal. ADRENALS/URINARY TRACT: Kidneys are orthotopic. Mildly delayed and striated RIGHT nephrogram with RIGHT urothelial enhancement. RIGHT pelviectasis. No hydronephrosis. 2 mm calculus within or adjacent to distal RIGHT ureter. Urinary bladder is partially distended, unremarkable. Punctate LEFT lower pole nephrolithiasis. STOMACH/BOWEL: The stomach, small and large bowel are normal in course and caliber without inflammatory changes. Normal appendix. VASCULAR/LYMPHATIC: Aortoiliac vessels are normal in course and caliber. No lymphadenopathy by CT size criteria. REPRODUCTIVE: Normal. OTHER: No intraperitoneal free fluid or free air. MUSCULOSKELETAL: Nonacute.  Small fat containing umbilical hernia. IMPRESSION: 1. RIGHT pyelonephritis. 2 mm distal RIGHT ureteral calculus versus phlebolith. RIGHT pelviectasis. 2. Punctate nonobstructing LEFT lower pole nephrolithiasis. Electronically Signed   By: CElon AlasM.D.   On: 02/21/2018 18:44    Pending Labs Unresulted Labs (From admission, onward)   Start     Ordered   02/22/18 0500  CBC  Tomorrow morning,   R     02/21/18 1956   02/22/18 00350 Basic metabolic panel  Tomorrow morning,   R     02/21/18 1956   02/21/18 1953  HIV antibody (Routine Testing)  Once,   R     02/21/18 1956   02/21/18 1858  Blood Culture (routine x 2)  BLOOD CULTURE X 2,   STAT     02/21/18 1858   02/21/18 1812  Urine culture  STAT,   STAT      02/21/18 1811      Vitals/Pain Today's Vitals   02/21/18 1853 02/21/18 1930 02/21/18 1938 02/21/18 2000  BP: 106/73 114/73  130/87  Pulse: (!) 111 (!) 115  (!) 118  Resp: 17 (!) 21  (!) 26  Temp:      TempSrc:      SpO2: 99% 99%  98%  Weight:      Height:      PainSc:   0-No pain     Isolation Precautions No active isolations  Medications Medications  iopamidol (ISOVUE-300) 61 % injection (  Hold 02/21/18 1806)  cefTRIAXone (ROCEPHIN) 2 g in sodium chloride 0.9 % 100 mL IVPB (0 g Intravenous Stopped 02/21/18 2016)  dextrose 5 %-0.45 % sodium chloride infusion ( Intravenous New Bag/Given 02/21/18 1949)  morphine 2 MG/ML injection 2-4 mg (has no administration in time range)  acetaminophen (TYLENOL) tablet 650 mg (has no administration in time range)    Or  acetaminophen (TYLENOL) suppository 650 mg (has no administration in time range)  ondansetron (ZOFRAN) tablet 4 mg (has no administration in time range)    Or  ondansetron [facility]) injection 4 mg (has no administration in time range)  fentaNYL (SUBLIMAZE) injection 25 mcg (25 mcg Intravenous Given 02/21/18 1554)  sodium chloride 0.9 % bolus 1,000 mL (0 mLs Intravenous Stopped 02/21/18 1902)  morphine 4 MG/ML injection 4 mg (4 mg Intravenous Given 02/21/18 1805)  ondansetron (ZOFRAN) injection 4 mg (4 mg Intravenous Given 02/21/18 1805)  iopamidol (ISOVUE-300) 61 % injection 100 mL (100 mLs Intravenous Contrast Given 02/21/18 1817)  sodium chloride 0.9 % bolus 1,500 mL (1,500 mLs Intravenous New Bag/Given 02/21/18 1948)    Mobility walks

## 2018-02-21 NOTE — H&P (Addendum)
History and Physical    Jeanne Frederick XAJ:[id number] DOB: 24-Apr-1996 DOA: 02/21/2018  PCP: Dettinger, Fransisca Kaufmann, MD  Patient coming from: Home  I have personally briefly reviewed patient's old medical records in [place]  Chief Complaint: Abd pain  HPI: Jeanne Frederick is a 22 y.o. female with medical history significant of allergies.  Patient presents to the ED with c/o lower abd pain, N/V.  No diarrhea no constipation.  Symptoms onset earlier today since waking up.  Persistent.  No dysuria.  Had R sided flank and back pain as well.   ED Course: WBC 14k.  Tachy to 110s.  CT reveals R sided pyelonephritis.  No hydronephrosis.  Questionable 21mm R lower ureter stone vs phlebolith.  Hospitalist asked to admit.  Urology coming to consult.   Review of Systems: As per HPI otherwise 10 point review of systems negative.   Past Medical History:  Diagnosis Date  . Allergy     History reviewed. No pertinent surgical history.   reports that she has never smoked. She has never used smokeless tobacco. She reports that she does not drink alcohol or use drugs.  Allergies  Allergen Reactions  . Lac Bovis Swelling and Other (See Comments)  . Beef-Derived Products     Alpha Gal Allergy   . Other Other (See Comments)    Cannot take capsules of any kind d/t alpha gal disease  . Pork-Derived Risk manager Gal Allergy    Family History  Problem Relation Age of Onset  . Diabetes Maternal Grandmother      Prior to Admission medications   Medication Sig Start Date End Date Taking? Authorizing Provider  norethindrone-ethinyl estradiol (JUNEL FE,GILDESS FE,LOESTRIN FE) 1-20 MG-MCG tablet Take 1 tablet by mouth daily.   Yes [provider]  EPINEPHrine (EPIPEN 2-PAK) 0.3 mg/0.3 mL IJ SOAJ injection Inject 1 pen as directed once as needed for anaphylaxis.    [provider]    Physical Exam: Vitals:   02/21/18 1730 02/21/18 1802 02/21/18 1853 02/21/18 1930    BP: 112/69 109/67 106/73 114/73  Pulse: (!) 102 (!) 110 (!) 111 (!) 115  Resp: 17 17 17  (!) 21  Temp:      TempSrc:      SpO2: 100% 99% 99% 99%  Weight:      Height:        Constitutional: NAD, calm, comfortable Eyes: PERRL, lids and conjunctivae normal ENMT: Mucous membranes are moist. Posterior pharynx clear of any exudate or lesions.Normal dentition.  Neck: normal, supple, no masses, no thyromegaly Respiratory: clear to auscultation bilaterally, no wheezing, no crackles. Normal respiratory effort. No accessory muscle use.  Cardiovascular: Regular rate and rhythm, no murmurs / rubs / gallops. No extremity edema. 2+ pedal pulses. No carotid bruits.  Abdomen: mild TTP Musculoskeletal: no clubbing / cyanosis. No joint deformity upper and lower extremities. Good ROM, no contractures. Normal muscle tone.  Skin: no rashes, lesions, ulcers. No induration Neurologic: CN 2-12 grossly intact. Sensation intact, DTR normal. Strength 5/5 in all 4.  Psychiatric: Normal judgment and insight. Alert and oriented x 3. Normal mood.    Labs on Admission: I have personally reviewed following labs and imaging studies  CBC: Recent Labs  Lab 02/21/18 1549  WBC 14.8*  NEUTROABS 12.9*  HGB 12.0  HCT 35.7*  MCV 94.4  PLT 094   Basic Metabolic Panel: Recent Labs  Lab 02/21/18 1549  NA 145  K 3.5  CL 112*  CO2 22  GLUCOSE 96  BUN 11  CREATININE 0.63  CALCIUM 9.2   GFR: Estimated Creatinine Clearance: 103.8 mL/min (by C-G formula based on SCr of 0.63 mg/dL). Liver Function Tests: Recent Labs  Lab 02/21/18 1549  AST 22  ALT 16  ALKPHOS 39  BILITOT 0.7  PROT 7.1  ALBUMIN 4.1   Recent Labs  Lab 02/21/18 1549  LIPASE 24   No results for input(s): AMMONIA in the last 168 hours. Coagulation Profile: No results for input(s): INR, PROTIME in the last 168 hours. Cardiac Enzymes: No results for input(s): CKTOTAL, CKMB, CKMBINDEX, TROPONINI in the last 168 hours. BNP (last 3  results) No results for input(s): PROBNP in the last 8760 hours. HbA1C: No results for input(s): HGBA1C in the last 72 hours. CBG: No results for input(s): GLUCAP in the last 168 hours. Lipid Profile: No results for input(s): CHOL, HDL, LDLCALC, TRIG, CHOLHDL, LDLDIRECT in the last 72 hours. Thyroid Function Tests: No results for input(s): TSH, T4TOTAL, FREET4, T3FREE, THYROIDAB in the last 72 hours. Anemia Panel: No results for input(s): VITAMINB12, FOLATE, FERRITIN, TIBC, IRON, RETICCTPCT in the last 72 hours. Urine analysis:    Component Value Date/Time   COLORURINE YELLOW 02/21/2018 1730   APPEARANCEUR HAZY (A) 02/21/2018 1730   LABSPEC 1.021 02/21/2018 1730   PHURINE 8.0 02/21/2018 1730   GLUCOSEU NEGATIVE 02/21/2018 1730   HGBUR LARGE (A) 02/21/2018 1730   BILIRUBINUR NEGATIVE 02/21/2018 1730   KETONESUR 80 (A) 02/21/2018 1730   PROTEINUR 100 (A) 02/21/2018 1730   NITRITE NEGATIVE 02/21/2018 1730   LEUKOCYTESUR NEGATIVE 02/21/2018 1730    Radiological Exams on Admission: Ct Abdomen Pelvis W Contrast  Result Date: 02/21/2018 CLINICAL DATA:  Abdominal discomfort since this morning. Intermittent cramping after changing birth control pills 2 months ago. Rectal pain radiating to the back. EXAM: CT ABDOMEN AND PELVIS WITH CONTRAST TECHNIQUE: Multidetector CT imaging of the abdomen and pelvis was performed using the standard protocol following bolus administration of intravenous contrast. CONTRAST:  128mL ISOVUE-300 IOPAMIDOL (ISOVUE-300) INJECTION 61% COMPARISON:  None. FINDINGS: LOWER CHEST: Mild dependent atelectasis clear. Included heart size is normal. No pericardial effusion. HEPATOBILIARY: Liver and gallbladder are normal. PANCREAS: Normal. SPLEEN: Normal. ADRENALS/URINARY TRACT: Kidneys are orthotopic. Mildly delayed and striated RIGHT nephrogram with RIGHT urothelial enhancement. RIGHT pelviectasis. No hydronephrosis. 2 mm calculus within or adjacent to distal RIGHT ureter.  Urinary bladder is partially distended, unremarkable. Punctate LEFT lower pole nephrolithiasis. STOMACH/BOWEL: The stomach, small and large bowel are normal in course and caliber without inflammatory changes. Normal appendix. VASCULAR/LYMPHATIC: Aortoiliac vessels are normal in course and caliber. No lymphadenopathy by CT size criteria. REPRODUCTIVE: Normal. OTHER: No intraperitoneal free fluid or free air. MUSCULOSKELETAL: Nonacute.  Small fat containing umbilical hernia. IMPRESSION: 1. RIGHT pyelonephritis. 2 mm distal RIGHT ureteral calculus versus phlebolith. RIGHT pelviectasis. 2. Punctate nonobstructing LEFT lower pole nephrolithiasis. Electronically Signed   By: Elon Alas M.D.   On: 02/21/2018 18:44    EKG: Independently reviewed.  Assessment/Plan Principal Problem:   Acute pyelonephritis Active Problems:   Sepsis ([place])    1. Acute pyelonephritis causing mild sepsis 1. Rocephin 2. IVF: 2.5L NS bolus then 100 cc/hr D5 half 3. Repeat CBC/BMP in AM 4. NPO till urology decides if intervention needed 5. zofran prn nausea, morphine prn pain 6. Urology coming to eval and decide if intervention needed for the questionable stone 7. UCx, BCx pending  DVT prophylaxis: SCDs until urology decides if stent procedure is needed or not. Code  Status: Full Family Communication: Family at bedside Disposition Plan: Home after admit Consults called: Urology Admission status: Admit to inpatient   Etta Quill DO [facility] Pager [phone]  If 7AM-7PM, please contact day team taking care of patient [email or web address] Password [facility]  02/21/2018, 7:56 PM

## 2018-02-21 NOTE — ED Notes (Signed)
Pt aware urine sample is needed 

## 2018-02-21 NOTE — ED Provider Notes (Signed)
[facility] DEPT Provider Note   CSN: [id number] Arrival date & time: 02/21/18  1447     History   Chief Complaint Chief Complaint  Patient presents with  . Abdominal Pain    HPI Jeanne Frederick is a 22 y.o. female with a past medical history of adenomyosis, who presents today for evaluation of lower abdominal discomfort.  She reports that she was fine yesterday, however today since waking up she has had intermittent lower abdominal pain and cramping.  She reports that about 2 months ago she saw her OB/GYN and they changed her birth control pills.  Patient denies any urinary symptoms, no dysuria, hematuria, increased urgency or frequency.  She does report some right-sided back pain in addition to generalized abdominal pain worse in the lower quadrants.  She has vomited 4 times today.  Denies any diarrhea or constipation.  No history of abdominal surgeries.  HPI  Past Medical History:  Diagnosis Date  . Allergy     Patient Active Problem List   Diagnosis Date Noted  . Tachycardia 10/09/2016  . Chest pain 10/09/2016    History reviewed. No pertinent surgical history.   OB History   None      Home Medications    Prior to Admission medications   Medication Sig Start Date End Date Taking? Authorizing Provider  norethindrone-ethinyl estradiol (JUNEL FE,GILDESS FE,LOESTRIN FE) 1-20 MG-MCG tablet Take 1 tablet by mouth daily.   Yes [provider]  EPINEPHrine (EPIPEN 2-PAK) 0.3 mg/0.3 mL IJ SOAJ injection Inject 1 pen as directed once as needed for anaphylaxis.    [provider]    Family History Family History  Problem Relation Age of Onset  . Diabetes Maternal Grandmother     Social History Social History   Tobacco Use  . Smoking status: Never Smoker  . Smokeless tobacco: Never Used  Substance Use Topics  . Alcohol use: No  . Drug use: No     Allergies   Lac bovis; Beef-derived products; Other; and  Pork-derived products   Review of Systems Review of Systems  Constitutional: Negative for chills and fever.  Gastrointestinal: Positive for abdominal pain, nausea and vomiting. Negative for constipation and diarrhea.  Genitourinary: Positive for menstrual problem. Negative for difficulty urinating, dysuria, flank pain, frequency, urgency, vaginal bleeding, vaginal discharge and vaginal pain.  Musculoskeletal: Negative for back pain and neck pain.  Neurological: Negative for headaches.  All other systems reviewed and are negative.    Physical Exam Updated Vital Signs BP 106/73 (BP Location: Right Arm)   Pulse (!) 111   Temp 98.4 F (36.9 C) (Oral)   Resp 17   Ht 5\' 3"  (1.6 m)   Wt 70.3 kg (155 lb)   SpO2 99%   BMI 27.46 kg/m   Physical Exam  Constitutional: She appears well-developed and well-nourished. No distress.  HENT:  Head: Normocephalic and atraumatic.  Eyes: Conjunctivae are normal.  Neck: Neck supple.  Cardiovascular: Normal rate and regular rhythm.  No murmur heard. Pulmonary/Chest: Effort normal and breath sounds normal. No respiratory distress.  Abdominal: Soft. Normal appearance and bowel sounds are normal. There is generalized tenderness (Very mild) and tenderness in the right lower quadrant, suprapubic area and left lower quadrant. There is no rigidity, no rebound, no guarding and no CVA tenderness.  Genitourinary:  Genitourinary Comments: Exam performed with chaperone in room.  No obvious lesions on external genitalia.  Speculum exam caused patient pain, patient was coached to relax .  No adnexal tenderness or fullness.  No cervical motion tenderness.  Scant  blood in the vaginal canal.  Musculoskeletal: She exhibits no edema.  Neurological: She is alert.  Skin: Skin is warm and dry.  Psychiatric: She has a normal mood and affect.  Nursing note and vitals reviewed.    ED Treatments / Results  Labs (all labs ordered are listed, but only abnormal results  are displayed) Labs Reviewed  WET PREP, GENITAL - Abnormal; Notable for the following components:      Result Value   WBC, Wet Prep HPF POC MANY (*)    All other components within normal limits  URINALYSIS, ROUTINE W REFLEX MICROSCOPIC - Abnormal; Notable for the following components:   APPearance HAZY (*)    Hgb urine dipstick LARGE (*)    Ketones, ur 80 (*)    Protein, ur 100 (*)    RBC / HPF >50 (*)    Bacteria, UA RARE (*)    Non Squamous Epithelial 0-5 (*)    All other components within normal limits  COMPREHENSIVE METABOLIC PANEL - Abnormal; Notable for the following components:   Chloride 112 (*)    All other components within normal limits  CBC WITH DIFFERENTIAL/PLATELET - Abnormal; Notable for the following components:   WBC 14.8 (*)    RBC 3.78 (*)    HCT 35.7 (*)    Neutro Abs 12.9 (*)    All other components within normal limits  URINE CULTURE  CULTURE, BLOOD (ROUTINE X 2)  CULTURE, BLOOD (ROUTINE X 2)  LIPASE, BLOOD  I-STAT BETA HCG BLOOD, ED ([facility], [facility], [facility] ONLY)  I-STAT CG4 LACTIC ACID, ED  GC/CHLAMYDIA PROBE AMP ([place]) NOT AT [facility]    EKG None  Radiology Ct Abdomen Pelvis W Contrast  Result Date: 02/21/2018 CLINICAL DATA:  Abdominal discomfort since this morning. Intermittent cramping after changing birth control pills 2 months ago. Rectal pain radiating to the back. EXAM: CT ABDOMEN AND PELVIS WITH CONTRAST TECHNIQUE: Multidetector CT imaging of the abdomen and pelvis was performed using the standard protocol following bolus administration of intravenous contrast. CONTRAST:  130mL ISOVUE-300 IOPAMIDOL (ISOVUE-300) INJECTION 61% COMPARISON:  None. FINDINGS: LOWER CHEST: Mild dependent atelectasis clear. Included heart size is normal. No pericardial effusion. HEPATOBILIARY: Liver and gallbladder are normal. PANCREAS: Normal. SPLEEN: Normal. ADRENALS/URINARY TRACT: Kidneys are orthotopic. Mildly delayed and striated RIGHT nephrogram with RIGHT urothelial  enhancement. RIGHT pelviectasis. No hydronephrosis. 2 mm calculus within or adjacent to distal RIGHT ureter. Urinary bladder is partially distended, unremarkable. Punctate LEFT lower pole nephrolithiasis. STOMACH/BOWEL: The stomach, small and large bowel are normal in course and caliber without inflammatory changes. Normal appendix. VASCULAR/LYMPHATIC: Aortoiliac vessels are normal in course and caliber. No lymphadenopathy by CT size criteria. REPRODUCTIVE: Normal. OTHER: No intraperitoneal free fluid or free air. MUSCULOSKELETAL: Nonacute.  Small fat containing umbilical hernia. IMPRESSION: 1. RIGHT pyelonephritis. 2 mm distal RIGHT ureteral calculus versus phlebolith. RIGHT pelviectasis. 2. Punctate nonobstructing LEFT lower pole nephrolithiasis. Electronically Signed   By: Elon Alas M.D.   On: 02/21/2018 18:44    Procedures Procedures (including critical care time) CRITICAL CARE Performed by: Wyn Quaker Total critical care time: 30 minutes Critical care time was exclusive of separately billable procedures and treating other patients. Critical care was necessary to treat or prevent imminent or life-threatening deterioration. Critical care was time spent personally by me on the following activities: development of treatment plan with patient and/or surrogate as well as nursing, discussions with consultants, evaluation of  patient's response to treatment, examination of patient, obtaining history from patient or surrogate, ordering and performing treatments and interventions, ordering and review of laboratory studies, ordering and review of radiographic studies, pulse oximetry and re-evaluation of patient's condition.  Sepsis  Medications Ordered in ED Medications  iopamidol (ISOVUE-300) 61 % injection (  Hold 02/21/18 1806)  cefTRIAXone (ROCEPHIN) 2 g in sodium chloride 0.9 % 100 mL IVPB (has no administration in time range)  sodium chloride 0.9 % bolus 1,500 mL (has no administration  in time range)  fentaNYL (SUBLIMAZE) injection 25 mcg (25 mcg Intravenous Given 02/21/18 1554)  sodium chloride 0.9 % bolus 1,000 mL (0 mLs Intravenous Stopped 02/21/18 1902)  morphine 4 MG/ML injection 4 mg (4 mg Intravenous Given 02/21/18 1805)  ondansetron (ZOFRAN) injection 4 mg (4 mg Intravenous Given 02/21/18 1805)  iopamidol (ISOVUE-300) 61 % injection 100 mL (100 mLs Intravenous Contrast Given 02/21/18 1817)     Initial Impression / Assessment and Plan / ED Course  I have reviewed the triage vital signs and the nursing notes.  Pertinent labs & imaging results that were available during my care of the patient were reviewed by me and considered in my medical decision making (see chart for details).  Clinical Course as of Feb 22 114  Sun Feb 21, 2018  1907 CT results reviewed, initially patient was not tachycardic, however she is now consistently tachycardic in the low 100s, has a white count of 14.8 with neutrophils of 12.9, urine with bacteria, red cells, protein, 0-5 white blood cells.  CT concerning for pyelonephritis with right-sided ureteral stone.  Sepsis called, antibiotics and cultures ordered, will consult placed to urology.   [EH]  1941 With Dr. Alcario Drought from hospitalist who accepts patient.   [EH]    Clinical Course User Index [EH] Lorin Glass, PA-C   Patient presents today for evaluation of generalized abdominal/pelvic pain.  Her urine had large protein, no nitrites or leukocytes, however rare bacteria with many RBC/hpf.  CBC with elevated white count 14.8, creatinine and other electrolytes without significant abnormalities or derangements.  Pregnancy test was negative.  Urine microscopy did have rare bacteria present.  Lipase is normal.  After discussions with patient CT abdomen pelvis was obtained showing concern for a right-sided pyelonephritis with possible ureterolithiasis.  Initially patient did not meet sepsis criteria, however while in the department she became  persistently tachycardic, and code sepsis was called given elevated white count, CT with reported concern for  pyelonephritis.  RN was notified, blood cultures were ordered.  I spoke with Dr. Alcario Drought from hospitalist who agreed to admit the states that they will see the patient in the morning.  Patient was hemodynamically stable for admission.  Final Clinical Impressions(s) / ED Diagnoses   Final diagnoses:  Acute pyelonephritis  Ureteral calculi    ED Discharge Orders    None       Ollen Gross 02/22/18 0119    Lajean Saver, MD 02/23/18 [phone]

## 2018-02-21 NOTE — ED Notes (Signed)
ED Provider at bedside. 

## 2018-02-22 DIAGNOSIS — R1110 Vomiting, unspecified: Secondary | ICD-10-CM

## 2018-02-22 DIAGNOSIS — E876 Hypokalemia: Secondary | ICD-10-CM

## 2018-02-22 DIAGNOSIS — N200 Calculus of kidney: Secondary | ICD-10-CM

## 2018-02-22 DIAGNOSIS — R112 Nausea with vomiting, unspecified: Secondary | ICD-10-CM

## 2018-02-22 LAB — BASIC METABOLIC PANEL
Anion gap: 8 (ref 5–15)
BUN: 7 mg/dL (ref 6–20)
CO2: 23 mmol/L (ref 22–32)
Calcium: 8.4 mg/dL — ABNORMAL LOW (ref 8.9–10.3)
Chloride: 109 mmol/L (ref 101–111)
Creatinine, Ser: 0.62 mg/dL (ref 0.44–1.00)
GFR calc Af Amer: >60 mL/min (ref >60)
GFR calc non Af Amer: >60 mL/min (ref >60)
Glucose, Bld: 106 mg/dL — ABNORMAL HIGH (ref 65–99)
Potassium: 3.2 mmol/L — ABNORMAL LOW (ref 3.5–5.1)
Sodium: 140 mmol/L (ref 135–145)

## 2018-02-22 LAB — CBC
HCT: 35.5 % — ABNORMAL LOW (ref 36.0–46.0)
Hemoglobin: 12.1 g/dL (ref 12.0–15.0)
MCH: 32.2 pg (ref 26.0–34.0)
MCHC: 34.1 g/dL (ref 30.0–36.0)
MCV: 94.4 fL (ref 78.0–100.0)
Platelets: 246 10*3/uL (ref 150–400)
RBC: 3.76 MIL/uL — ABNORMAL LOW (ref 3.87–5.11)
RDW: 12.8 % (ref 11.5–15.5)
WBC: 13.2 10*3/uL — ABNORMAL HIGH (ref 4.0–10.5)

## 2018-02-22 LAB — GC/CHLAMYDIA PROBE AMP (~~LOC~~) NOT AT ARMC
Chlamydia: NEGATIVE
Neisseria Gonorrhea: NEGATIVE

## 2018-02-22 LAB — MAGNESIUM: Magnesium: 1.8 mg/dL (ref 1.7–2.4)

## 2018-02-22 LAB — HIV ANTIBODY (ROUTINE TESTING W REFLEX): HIV Screen 4th Generation wRfx: NONREACTIVE

## 2018-02-22 MED ORDER — PROMETHAZINE HCL 25 MG/ML IJ SOLN
25.0000 mg | Freq: Four times a day (QID) | INTRAMUSCULAR | Status: DC | PRN
  Administered 2018-02-22: 25 mg via INTRAVENOUS
  Filled 2018-02-22: qty 1

## 2018-02-22 MED ORDER — HYDROMORPHONE HCL 1 MG/ML IJ SOLN
1.0000 mg | INTRAMUSCULAR | Status: DC | PRN
  Administered 2018-02-22: 1 mg via INTRAVENOUS
  Filled 2018-02-22: qty 1

## 2018-02-22 MED ORDER — POTASSIUM CHLORIDE CRYS ER 20 MEQ PO TBCR
40.0000 meq | EXTENDED_RELEASE_TABLET | ORAL | Status: AC
  Administered 2018-02-22 (×2): 40 meq via ORAL
  Filled 2018-02-22 (×2): qty 2

## 2018-02-22 MED ORDER — OXYCODONE HCL 5 MG PO TABS
7.5000 mg | ORAL_TABLET | ORAL | Status: DC | PRN
  Administered 2018-02-22: 7.5 mg via ORAL
  Filled 2018-02-22: qty 2

## 2018-02-22 MED ORDER — PROMETHAZINE HCL 25 MG/ML IJ SOLN: 25.0000 mg | Freq: Four times a day (QID) | INTRAMUSCULAR | Status: DC | PRN

## 2018-02-22 MED ORDER — ONDANSETRON HCL 4 MG/2ML IJ SOLN: 4.0000 mg | Freq: Four times a day (QID) | INTRAMUSCULAR | Status: DC

## 2018-02-22 MED ORDER — KCL IN DEXTROSE-NACL 40-5-0.9 MEQ/L-%-% IV SOLN
INTRAVENOUS | Status: DC
  Administered 2018-02-22 – 2018-02-23 (×3): via INTRAVENOUS
  Filled 2018-02-22 (×4): qty 1000

## 2018-02-22 MED ORDER — PROMETHAZINE HCL 25 MG/ML IJ SOLN
12.5000 mg | Freq: Once | INTRAMUSCULAR | Status: AC
  Administered 2018-02-22: 12.5 mg via INTRAVENOUS
  Filled 2018-02-22: qty 1

## 2018-02-22 MED ORDER — ONDANSETRON HCL 4 MG PO TABS
4.0000 mg | ORAL_TABLET | Freq: Four times a day (QID) | ORAL | Status: DC
  Administered 2018-02-22 – 2018-02-23 (×4): 4 mg via ORAL
  Filled 2018-02-22 (×4): qty 1

## 2018-02-22 MED ORDER — TAMSULOSIN HCL 0.4 MG PO CAPS
0.4000 mg | ORAL_CAPSULE | Freq: Every day | ORAL | Status: DC
  Filled 2018-02-22 (×2): qty 1

## 2018-02-22 MED ORDER — FENTANYL CITRATE (PF) 100 MCG/2ML IJ SOLN
25.0000 ug | INTRAMUSCULAR | Status: DC | PRN
  Administered 2018-02-22 (×2): 25 ug via INTRAVENOUS
  Filled 2018-02-22 (×2): qty 2

## 2018-02-22 NOTE — Progress Notes (Signed)
Urology  The patient was seen and examined this afternoon.  Her pain has markedly improved since this morning.  She has not required pain medication throughout the day today.  She denies nausea/vomiting or fever/chills.  She did pass a small blood clot and what appears to be possible stone fragment that was identified in her urine strainer.  Given that her symptoms/flank pain have dramatically throughout today, I will arrange an outpatient follow-up in 1 month for repeat renal ultrasound  Ellison Hughs, MD [facility]

## 2018-02-22 NOTE — Progress Notes (Signed)
PROGRESS NOTE    Jeanne Frederick   DVV:[id number]  DOB: [birth year]  DOA: 02/21/2018 PCP: Dettinger, Fransisca Kaufmann, MD   Brief Narrative:  Jeanne Frederick with a 22 y/o female who presents for lower abdominal pain and right flank pain with nausea and vomiting. She was found to have right nephrolithiasis. CT showed a 83mm stone suspected to be in the right ureter with NO hydronephrosis. UA was negative for infection. She had no fever or leukocytosis.    Subjective: Ongoing sharp right flank pain. Mild nausea. No vomiting. Voiding without dysuria.     Assessment & Plan:   Principal Problem:   Right nephrolithiasis - pain control with IV and oral medications - Urology has seen her and, per the fellow's note, they will place a stent if she was to have a fever - cont IVF - strain urine - she states she is allergic to all capsules and cannot take Flomax that has been ordered  Active Problems:   Vomiting - Zofran QID and Phenergan QID PRN    Hypokalemia - replace orally and IV  Leukocytosis - likely stress response- UA neg for infection and no dysuria    DVT prophylaxis: SCDs Code Status: Full code Family Communication: spouse and mother in law Disposition Plan: home when pain better controlled Consultants:   urology Procedures:   none Antimicrobials:  Anti-infectives (From admission, onward)   Start     Dose/Rate Route Frequency Ordered Stop   02/21/18 1900  cefTRIAXone (ROCEPHIN) 2 g in sodium chloride 0.9 % 100 mL IVPB  Status:  Discontinued     2 g 200 mL/hr over 30 Minutes Intravenous Every 24 hours 02/21/18 1858 02/22/18 0737       Objective: Vitals:   02/21/18 1930 02/21/18 2000 02/21/18 2117 02/22/18 0447  BP: 114/73 130/87 100/76 (!) 99/54  Pulse: (!) 115 (!) 118 (!) 104 88  Resp: (!) 21 (!) 26 (!) 24 16  Temp:   (!) 97.5 F (36.4 C) 97.7 F (36.5 C)  TempSrc:   Oral Oral  SpO2: 99% 98% 100% 100%  Weight:      Height:        Intake/Output Summary  (Last 24 hours) at 02/22/2018 1056 Last data filed at 02/22/2018 0900 Gross per 24 hour  Intake 2538.33 ml  Output 900 ml  Net 1638.33 ml   Filed Weights   02/21/18 1453  Weight: 70.3 kg (155 lb)    Examination: General exam: Appears uncomfortable  HEENT: PERRLA, oral mucosa moist, no sclera icterus or thrush Respiratory system: Clear to auscultation. Respiratory effort normal. Cardiovascular system: S1 & S2 heard, RRR. tachycardic  Gastrointestinal system: Abdomen soft, non-tender, nondistended. Normal bowel sound. No organomegaly Central nervous system: Alert and oriented. No focal neurological deficits. Back: right CVA tenderness Extremities: No cyanosis, clubbing or edema Skin: No rashes or ulcers Psychiatry:  Mood & affect appropriate.     Data Reviewed: I have personally reviewed following labs and imaging studies  CBC: Recent Labs  Lab 02/21/18 1549 02/22/18 0501  WBC 14.8* 13.2*  NEUTROABS 12.9*  --   HGB 12.0 12.1  HCT 35.7* 35.5*  MCV 94.4 94.4  PLT 242 626   Basic Metabolic Panel: Recent Labs  Lab 02/21/18 1549 02/22/18 0501  NA 145 140  K 3.5 3.2*  CL 112* 109  CO2 22 23  GLUCOSE 96 106*  BUN 11 7  CREATININE 0.63 0.62  CALCIUM 9.2 8.4*  MG  --  1.8   GFR:  Estimated Creatinine Clearance: 103.8 mL/min (by C-G formula based on SCr of 0.62 mg/dL). Liver Function Tests: Recent Labs  Lab 02/21/18 1549  AST 22  ALT 16  ALKPHOS 39  BILITOT 0.7  PROT 7.1  ALBUMIN 4.1   Recent Labs  Lab 02/21/18 1549  LIPASE 24   No results for input(s): AMMONIA in the last 168 hours. Coagulation Profile: No results for input(s): INR, PROTIME in the last 168 hours. Cardiac Enzymes: No results for input(s): CKTOTAL, CKMB, CKMBINDEX, TROPONINI in the last 168 hours. BNP (last 3 results) No results for input(s): PROBNP in the last 8760 hours. HbA1C: No results for input(s): HGBA1C in the last 72 hours. CBG: No results for input(s): GLUCAP in the last  168 hours. Lipid Profile: No results for input(s): CHOL, HDL, LDLCALC, TRIG, CHOLHDL, LDLDIRECT in the last 72 hours. Thyroid Function Tests: No results for input(s): TSH, T4TOTAL, FREET4, T3FREE, THYROIDAB in the last 72 hours. Anemia Panel: No results for input(s): VITAMINB12, FOLATE, FERRITIN, TIBC, IRON, RETICCTPCT in the last 72 hours. Urine analysis:    Component Value Date/Time   COLORURINE YELLOW 02/21/2018 1730   APPEARANCEUR HAZY (A) 02/21/2018 1730   LABSPEC 1.021 02/21/2018 1730   PHURINE 8.0 02/21/2018 1730   GLUCOSEU NEGATIVE 02/21/2018 1730   HGBUR LARGE (A) 02/21/2018 1730   BILIRUBINUR NEGATIVE 02/21/2018 1730   KETONESUR 80 (A) 02/21/2018 1730   PROTEINUR 100 (A) 02/21/2018 1730   NITRITE NEGATIVE 02/21/2018 1730   LEUKOCYTESUR NEGATIVE 02/21/2018 1730   Sepsis Labs: @LABRCNTIP (procalcitonin:4,lacticidven:4) ) Recent Results (from the past 240 hour(s))  Wet prep, genital     Status: Abnormal   Collection Time: 02/21/18  5:56 PM  Result Value Ref Range Status   Yeast Wet Prep HPF POC NONE SEEN NONE SEEN Final   Trich, Wet Prep NONE SEEN NONE SEEN Final   Clue Cells Wet Prep HPF POC NONE SEEN NONE SEEN Final   WBC, Wet Prep HPF POC MANY (A) NONE SEEN Final   Sperm NONE SEEN  Final    Comment: Performed at [facility], [place] [address] [place], [place] [address]         Radiology Studies: Ct Abdomen Pelvis W Contrast  Result Date: 02/21/2018 CLINICAL DATA:  Abdominal discomfort since this morning. Intermittent cramping after changing birth control pills 2 months ago. Rectal pain radiating to the back. EXAM: CT ABDOMEN AND PELVIS WITH CONTRAST TECHNIQUE: Multidetector CT imaging of the abdomen and pelvis was performed using the standard protocol following bolus administration of intravenous contrast. CONTRAST:  116mL ISOVUE-300 IOPAMIDOL (ISOVUE-300) INJECTION 61% COMPARISON:  None. FINDINGS: LOWER CHEST: Mild dependent atelectasis clear.  Included heart size is normal. No pericardial effusion. HEPATOBILIARY: Liver and gallbladder are normal. PANCREAS: Normal. SPLEEN: Normal. ADRENALS/URINARY TRACT: Kidneys are orthotopic. Mildly delayed and striated RIGHT nephrogram with RIGHT urothelial enhancement. RIGHT pelviectasis. No hydronephrosis. 2 mm calculus within or adjacent to distal RIGHT ureter. Urinary bladder is partially distended, unremarkable. Punctate LEFT lower pole nephrolithiasis. STOMACH/BOWEL: The stomach, small and large bowel are normal in course and caliber without inflammatory changes. Normal appendix. VASCULAR/LYMPHATIC: Aortoiliac vessels are normal in course and caliber. No lymphadenopathy by CT size criteria. REPRODUCTIVE: Normal. OTHER: No intraperitoneal free fluid or free air. MUSCULOSKELETAL: Nonacute.  Small fat containing umbilical hernia. IMPRESSION: 1. RIGHT pyelonephritis. 2 mm distal RIGHT ureteral calculus versus phlebolith. RIGHT pelviectasis. 2. Punctate nonobstructing LEFT lower pole nephrolithiasis. Electronically Signed   By: Elon Alas M.D.   On: 02/21/2018 18:44  Scheduled Meds: . ondansetron  4 mg Oral Q6H   Or  . ondansetron (ZOFRAN) IV  4 mg Intravenous Q6H  . potassium chloride  40 mEq Oral Q4H  . tamsulosin  0.4 mg Oral Daily   Continuous Infusions: . dextrose 5 % and 0.9 % NaCl with KCl 40 mEq/L 125 mL/hr at 02/22/18 0933     LOS: 1 day    Time spent in minutes: 35    Debbe Odea, MD [facility] Pager: [email or web address] Password [facility] 02/22/2018, 10:56 AM

## 2018-02-23 LAB — CBC
HCT: 35 % — ABNORMAL LOW (ref 36.0–46.0)
Hemoglobin: 11.1 g/dL — ABNORMAL LOW (ref 12.0–15.0)
MCH: 31.3 pg (ref 26.0–34.0)
MCHC: 31.7 g/dL (ref 30.0–36.0)
MCV: 98.6 fL (ref 78.0–100.0)
Platelets: 207 10*3/uL (ref 150–400)
RBC: 3.55 MIL/uL — ABNORMAL LOW (ref 3.87–5.11)
RDW: 12.9 % (ref 11.5–15.5)
WBC: 8.1 10*3/uL (ref 4.0–10.5)

## 2018-02-23 LAB — BASIC METABOLIC PANEL
Anion gap: 8 (ref 5–15)
BUN: 5 mg/dL — ABNORMAL LOW (ref 6–20)
CO2: 23 mmol/L (ref 22–32)
Calcium: 8.5 mg/dL — ABNORMAL LOW (ref 8.9–10.3)
Chloride: 108 mmol/L (ref 101–111)
Creatinine, Ser: 0.71 mg/dL (ref 0.44–1.00)
GFR calc Af Amer: >60 mL/min (ref >60)
GFR calc non Af Amer: >60 mL/min (ref >60)
Glucose, Bld: 104 mg/dL — ABNORMAL HIGH (ref 65–99)
Potassium: 4 mmol/L (ref 3.5–5.1)
Sodium: 139 mmol/L (ref 135–145)

## 2018-02-23 LAB — URINE CULTURE

## 2018-02-23 MED ORDER — ACETAMINOPHEN 325 MG PO TABS: 650.0000 mg | ORAL_TABLET | Freq: Four times a day (QID) | ORAL | Status: DC | PRN

## 2018-02-23 NOTE — Discharge Summary (Signed)
Physician Discharge Summary  Aamira Bischoff BJS:[id number] DOB: 10-Apr-1996 DOA: 02/21/2018  PCP: Dettinger, Fransisca Kaufmann, MD  Admit date: 02/21/2018 Discharge date: 02/23/2018  Admitted From: home  Disposition:  home   Recommendations for Outpatient Follow-up:  1. F/u with Urology in 1 month Discharge Condition:  stable   CODE STATUS:  Full code   Diet recommendation:  regular Consultations:  urology    Discharge Diagnoses:  Principal Problem:   Right nephrolithiasis Active Problems:   Vomiting   Hypokalemia    Subjective: Nausea/ vomiting resolved yesterday and she passed a clot which may have contained the stone. Subsequently she had no further flank pain. Diet was advanced and she is tolerating solids now.   Brief Summary: Jeanne Frederick with a 22 y/o female who presents for lower abdominal pain and right flank pain with nausea and vomiting. She was found to have right nephrolithiasis. CT showed a 63mm stone suspected to be in the right ureter with NO hydronephrosis. UA was negative for infection. She had no fever or leukocytosis.     Hospital Course:  Principal Problem:   Right nephrolithiasis - pain control with IV and oral medications - Urology has seen her and, per the fellow's note, they will place a stent if she was to have a fever - she states she is allergic to all capsules and cannot take Flomax that has been ordered - pain resolved- see above- passed a clot which may have contained the stone  Active Problems:   Vomiting - diet advanced to solids which she is tolerating since yesterday    Hypokalemia - replaced orally and IV  Leukocytosis - likely stress response- UA neg for infection and no dysuria - resolved     Discharge Exam: Vitals:   02/22/18 2047 02/23/18 0523  BP: 104/63 105/62  Pulse: 89 72  Resp: 16 18  Temp: 98.3 F (36.8 C) 98.9 F (37.2 C)  SpO2: 100% 100%   Vitals:   02/22/18 0447 02/22/18 1400 02/22/18 2047 02/23/18 0523   BP: (!) 99/54 106/62 104/63 105/62  Pulse: 88 88 89 72  Resp: 16 16 16 18   Temp: 97.7 F (36.5 C) 97.7 F (36.5 C) 98.3 F (36.8 C) 98.9 F (37.2 C)  TempSrc: Oral Oral Oral Oral  SpO2: 100% 100% 100% 100%  Weight:      Height:        General: Pt is alert, awake, not in acute distress Cardiovascular: RRR, S1/S2 +, no rubs, no gallops Respiratory: CTA bilaterally, no wheezing, no rhonchi Abdominal: Soft, NT, ND, bowel sounds + Extremities: no edema, no cyanosis   Discharge Instructions  Discharge Instructions    Diet - low sodium heart healthy   Complete by:  As directed    Increase activity slowly   Complete by:  As directed      Allergies as of 02/23/2018      Reactions   Lac Bovis Swelling, Other (See Comments)   Beef-derived Products    Alpha Gal Allergy   Other Other (See Comments)   Cannot take capsules of any kind d/t alpha gal disease   Pork-derived Products    Alpha Gal Allergy      Medication List    TAKE these medications   acetaminophen 325 MG tablet Commonly known as:  TYLENOL Take 2 tablets (650 mg total) by mouth every 6 (six) hours as needed for mild pain (or Fever >/= 101).   EPIPEN 2-PAK 0.3 mg/0.3 mL Soaj injection Generic drug:  EPINEPHrine Inject 1 pen as directed once as needed for anaphylaxis.   norethindrone-ethinyl estradiol 1-20 MG-MCG tablet Commonly known as:  JUNEL FE,GILDESS FE,LOESTRIN FE Take 1 tablet by mouth daily.      Follow-up Information    Ceasar Mons, MD In 1 month.   Specialty:  Urology Why:  Right kidney ultrasound Contact information: [address] [place] [address] [phone]          Allergies  Allergen Reactions  . Lac Bovis Swelling and Other (See Comments)  . Beef-Derived Products     Alpha Gal Allergy   . Other Other (See Comments)    Cannot take capsules of any kind d/t alpha gal disease  . Pork-Derived Products     Alpha Gal Allergy      Procedures/Studies:   Ct Abdomen Pelvis W Contrast  Result Date: 02/21/2018 CLINICAL DATA:  Abdominal discomfort since this morning. Intermittent cramping after changing birth control pills 2 months ago. Rectal pain radiating to the back. EXAM: CT ABDOMEN AND PELVIS WITH CONTRAST TECHNIQUE: Multidetector CT imaging of the abdomen and pelvis was performed using the standard protocol following bolus administration of intravenous contrast. CONTRAST:  142mL ISOVUE-300 IOPAMIDOL (ISOVUE-300) INJECTION 61% COMPARISON:  None. FINDINGS: LOWER CHEST: Mild dependent atelectasis clear. Included heart size is normal. No pericardial effusion. HEPATOBILIARY: Liver and gallbladder are normal. PANCREAS: Normal. SPLEEN: Normal. ADRENALS/URINARY TRACT: Kidneys are orthotopic. Mildly delayed and striated RIGHT nephrogram with RIGHT urothelial enhancement. RIGHT pelviectasis. No hydronephrosis. 2 mm calculus within or adjacent to distal RIGHT ureter. Urinary bladder is partially distended, unremarkable. Punctate LEFT lower pole nephrolithiasis. STOMACH/BOWEL: The stomach, small and large bowel are normal in course and caliber without inflammatory changes. Normal appendix. VASCULAR/LYMPHATIC: Aortoiliac vessels are normal in course and caliber. No lymphadenopathy by CT size criteria. REPRODUCTIVE: Normal. OTHER: No intraperitoneal free fluid or free air. MUSCULOSKELETAL: Nonacute.  Small fat containing umbilical hernia. IMPRESSION: 1. RIGHT pyelonephritis. 2 mm distal RIGHT ureteral calculus versus phlebolith. RIGHT pelviectasis. 2. Punctate nonobstructing LEFT lower pole nephrolithiasis. Electronically Signed   By: Elon Alas M.D.   On: 02/21/2018 18:44      The results of significant diagnostics from this hospitalization (including imaging, microbiology, ancillary and laboratory) are listed below for reference.     Microbiology: Recent Results (from the past 240 hour(s))  Wet prep, genital     Status:  Abnormal   Collection Time: 02/21/18  5:56 PM  Result Value Ref Range Status   Yeast Wet Prep HPF POC NONE SEEN NONE SEEN Final   Trich, Wet Prep NONE SEEN NONE SEEN Final   Clue Cells Wet Prep HPF POC NONE SEEN NONE SEEN Final   WBC, Wet Prep HPF POC MANY (A) NONE SEEN Final   Sperm NONE SEEN  Final    Comment: Performed at [facility], [place] [address], [place], [place] [address]  Urine culture     Status: Abnormal   Collection Time: 02/21/18  6:12 PM  Result Value Ref Range Status   Specimen Description   Final    URINE, RANDOM Performed at [facility] [address] [place], [place] [address]    Special Requests   Final    NONE Performed at [facility], [place] [address], [place], [place] [address]    Culture MULTIPLE SPECIES PRESENT, SUGGEST RECOLLECTION (A)  Final   Report Status 02/23/2018 FINAL  Final  Blood Culture (routine x 2)  Status: None (Preliminary result)   Collection Time: 02/21/18  6:58 PM  Result Value Ref Range Status   Specimen Description   Final    BLOOD LEFT ANTECUBITAL Performed at [facility] [address], [place], [place] [address]    Special Requests   Final    BOTTLES DRAWN AEROBIC AND ANAEROBIC Blood Culture results may not be optimal due to an excessive volume of blood received in culture bottles Performed at [facility] [address], [place], [place] [address]    Culture   Final    NO GROWTH < 24 HOURS Performed at [facility] [address], [place], [place] [address]    Report Status PENDING  Incomplete  Blood Culture (routine x 2)     Status: None (Preliminary result)   Collection Time: 02/21/18  7:30 PM  Result Value Ref Range Status   Specimen Description   Final    BLOOD BLOOD LEFT HAND Performed at [facility] [address] [place], [place] [address]    Special Requests   Final    BOTTLES DRAWN  AEROBIC AND ANAEROBIC Blood Culture adequate volume Performed at [facility] [address], [place], [place] [address]    Culture   Final    NO GROWTH < 24 HOURS Performed at [facility] [address], [place],  [address]    Report Status PENDING  Incomplete     Labs: BNP (last 3 results) No results for input(s): BNP in the last 8760 hours. Basic Metabolic Panel: Recent Labs  Lab 02/21/18 1549 02/22/18 0501 02/23/18 0442  NA 145 140 139  K 3.5 3.2* 4.0  CL 112* 109 108  CO2 22 23 23   GLUCOSE 96 106* 104*  BUN 11 7 5*  CREATININE 0.63 0.62 0.71  CALCIUM 9.2 8.4* 8.5*  MG  --  1.8  --    Liver Function Tests: Recent Labs  Lab 02/21/18 1549  AST 22  ALT 16  ALKPHOS 39  BILITOT 0.7  PROT 7.1  ALBUMIN 4.1   Recent Labs  Lab 02/21/18 1549  LIPASE 24   No results for input(s): AMMONIA in the last 168 hours. CBC: Recent Labs  Lab 02/21/18 1549 02/22/18 0501 02/23/18 0442  WBC 14.8* 13.2* 8.1  NEUTROABS 12.9*  --   --   HGB 12.0 12.1 11.1*  HCT 35.7* 35.5* 35.0*  MCV 94.4 94.4 98.6  PLT 242 246 207   Cardiac Enzymes: No results for input(s): CKTOTAL, CKMB, CKMBINDEX, TROPONINI in the last 168 hours. BNP: Invalid input(s): POCBNP CBG: No results for input(s): GLUCAP in the last 168 hours. D-Dimer No results for input(s): DDIMER in the last 72 hours. Hgb A1c No results for input(s): HGBA1C in the last 72 hours. Lipid Profile No results for input(s): CHOL, HDL, LDLCALC, TRIG, CHOLHDL, LDLDIRECT in the last 72 hours. Thyroid function studies No results for input(s): TSH, T4TOTAL, T3FREE, THYROIDAB in the last 72 hours.  Invalid input(s): FREET3 Anemia work up No results for input(s): VITAMINB12, FOLATE, FERRITIN, TIBC, IRON, RETICCTPCT in the last 72 hours. Urinalysis    Component Value Date/Time   COLORURINE YELLOW 02/21/2018 1730   APPEARANCEUR HAZY (A) 02/21/2018 1730   LABSPEC 1.021 02/21/2018 1730    PHURINE 8.0 02/21/2018 1730   GLUCOSEU NEGATIVE 02/21/2018 1730   HGBUR LARGE (A) 02/21/2018 1730   BILIRUBINUR NEGATIVE 02/21/2018 1730   KETONESUR 80 (A) 02/21/2018 1730  PROTEINUR 100 (A) 02/21/2018 1730   NITRITE NEGATIVE 02/21/2018 1730   LEUKOCYTESUR NEGATIVE 02/21/2018 1730   Sepsis Labs Invalid input(s): PROCALCITONIN,  WBC,  LACTICIDVEN Microbiology Recent Results (from the past 240 hour(s))  Wet prep, genital     Status: Abnormal   Collection Time: 02/21/18  5:56 PM  Result Value Ref Range Status   Yeast Wet Prep HPF POC NONE SEEN NONE SEEN Final   Trich, Wet Prep NONE SEEN NONE SEEN Final   Clue Cells Wet Prep HPF POC NONE SEEN NONE SEEN Final   WBC, Wet Prep HPF POC MANY (A) NONE SEEN Final   Sperm NONE SEEN  Final    Comment: Performed at [facility], [place] [address], [place], [place] [address]  Urine culture     Status: Abnormal   Collection Time: 02/21/18  6:12 PM  Result Value Ref Range Status   Specimen Description   Final    URINE, RANDOM Performed at [facility] [address] [place], [place] [address]    Special Requests   Final    NONE Performed at [facility], [place] [address] [place], [place] [address]    Culture MULTIPLE SPECIES PRESENT, SUGGEST RECOLLECTION (A)  Final   Report Status 02/23/2018 FINAL  Final  Blood Culture (routine x 2)     Status: None (Preliminary result)   Collection Time: 02/21/18  6:58 PM  Result Value Ref Range Status   Specimen Description   Final    BLOOD LEFT ANTECUBITAL Performed at [facility] [address] [place], [place] [address]    Special Requests   Final    BOTTLES DRAWN AEROBIC AND ANAEROBIC Blood Culture results may not be optimal due to an excessive volume of blood received in culture bottles Performed at [facility] [address], [place], [place] [address]    Culture   Final    NO GROWTH < 24  HOURS Performed at [facility] [address] [place], [place] [address]    Report Status PENDING  Incomplete  Blood Culture (routine x 2)     Status: None (Preliminary result)   Collection Time: 02/21/18  7:30 PM  Result Value Ref Range Status   Specimen Description   Final    BLOOD BLOOD LEFT HAND Performed at [facility] [address] [place], [place] [address]    Special Requests   Final    BOTTLES DRAWN AEROBIC AND ANAEROBIC Blood Culture adequate volume Performed at [facility] [address] [place], [place] [address]    Culture   Final    NO GROWTH < 24 HOURS Performed at [facility] [address] [place], [place] [address]    Report Status PENDING  Incomplete     Time coordinating discharge in minutes: 13  SIGNED:   Debbe Odea, MD  [facility] 02/23/2018, 8:40 AM Pager   If 7PM-7AM, please contact night-coverage [email or web address] Password [email or web address]

## 2018-02-26 LAB — CULTURE, BLOOD (ROUTINE X 2)
Culture: NO GROWTH
Culture: NO GROWTH
Special Requests: ADEQUATE

## 2018-03-26 DIAGNOSIS — N10 Acute tubulo-interstitial nephritis: Secondary | ICD-10-CM | POA: Diagnosis not present

## 2018-03-26 DIAGNOSIS — R8279 Other abnormal findings on microbiological examination of urine: Secondary | ICD-10-CM | POA: Diagnosis not present

## 2018-03-26 DIAGNOSIS — N130 Hydronephrosis with ureteropelvic junction obstruction: Secondary | ICD-10-CM | POA: Diagnosis not present

## 2018-03-30 DIAGNOSIS — R8279 Other abnormal findings on microbiological examination of urine: Secondary | ICD-10-CM | POA: Diagnosis not present

## 2018-04-06 DIAGNOSIS — R8279 Other abnormal findings on microbiological examination of urine: Secondary | ICD-10-CM | POA: Diagnosis not present

## 2018-04-13 DIAGNOSIS — R8279 Other abnormal findings on microbiological examination of urine: Secondary | ICD-10-CM | POA: Diagnosis not present

## 2018-06-08 ENCOUNTER — Ambulatory Visit: Payer: BLUE CROSS/BLUE SHIELD | Admitting: Nurse Practitioner

## 2018-06-08 ENCOUNTER — Encounter: Payer: Self-pay | Admitting: Nurse Practitioner

## 2018-06-08 VITALS — BP 131/74 | HR 112 | Temp 100.7°F | Ht 63.0 in | Wt 159.0 lb

## 2018-06-08 DIAGNOSIS — J069 Acute upper respiratory infection, unspecified: Secondary | ICD-10-CM | POA: Diagnosis not present

## 2018-06-08 DIAGNOSIS — J029 Acute pharyngitis, unspecified: Secondary | ICD-10-CM

## 2018-06-08 LAB — RAPID STREP SCREEN (MED CTR MEBANE ONLY): Strep Gp A Ag, IA W/Reflex: NEGATIVE

## 2018-06-08 LAB — CULTURE, GROUP A STREP

## 2018-06-08 NOTE — Progress Notes (Signed)
   Subjective:    Patient ID: Jeanne Frederick, female    DOB: 10/16/1995, 22 y.o.   MRN: [id number]   Chief Complaint: Sore Throat; Emesis; and Fever   HPI Sore throat yesterday at work, coughing, emesis x2, chills.  Has not checked her temperature but says she feels like she has one.  Feels a little better this morning. Took Tylenol, Mucinex, & Delsym.  Waves of nausea hit randomly, vomited twice since yesterday, does not associate coughing fits with vomiting.  LMP 2 weeks ago & on birth control.  Recent sick coworkers (works at [employer]) with URI symptoms.    Review of Systems  Constitutional: Positive for chills, fatigue and fever.  HENT: Positive for congestion, postnasal drip and rhinorrhea.   Eyes: Negative.   Respiratory: Positive for cough.   Cardiovascular: Negative.   Gastrointestinal: Positive for nausea.  Endocrine: Negative.   Genitourinary: Negative.   Musculoskeletal: Positive for arthralgias.  Skin: Negative.   Neurological: Negative.   Hematological: Negative.   Psychiatric/Behavioral: Negative.        Objective:   Physical Exam  Constitutional: She is oriented to person, place, and time. She appears well-developed and well-nourished.  HENT:  Head: Normocephalic and atraumatic.  Right Ear: Tympanic membrane normal.  Left Ear: Tympanic membrane normal.  Mouth/Throat: Mucous membranes are normal. Posterior oropharyngeal erythema present.  Eyes: Pupils are equal, round, and reactive to light. EOM are normal.  Neck: Normal range of motion. Neck supple.  Cardiovascular: Normal heart sounds.  Pulmonary/Chest: Effort normal.  Abdominal: Soft. Bowel sounds are normal.  Neurological: She is alert and oriented to person, place, and time.  Skin: Skin is warm and dry.  Psychiatric: She has a normal mood and affect.    BP 131/74   Pulse (!) 112   Temp (!) 100.7 F (38.2 C) (Oral)   Ht 5\' 3"  (1.6 m)   Wt 159 lb (72.1 kg)   BMI 28.17 kg/m          Assessment & Plan:  .Jeanne Frederick in today with chief complaint of Sore Throat; Emesis; and Fever   1. Sore throat - Rapid Strep Screen ([facility] ONLY)  2. Viral upper respiratory infection 1. Take meds as prescribed 2. Use a cool mist humidifier especially during the winter months and when heat has been humid. 3. Use saline nose sprays frequently 4. Saline irrigations of the nose can be very helpful if done frequently.  * 4X daily for 1 week*  * Use of a nettie pot can be helpful with this. Follow directions with this* 5. Drink plenty of fluids 6. Keep thermostat turn down low 7.For any cough or congestion  Use plain Mucinex- regular strength or max strength is fine   * Children- consult with Pharmacist for dosing 8. For fever or aces or pains- take tylenol or ibuprofen appropriate for age and weight.  * for fevers greater than 101 orally you may alternate ibuprofen and tylenol every  3 hours.   Mary-Margaret Hassell Done, FNP

## 2018-06-08 NOTE — Patient Instructions (Signed)

## 2018-06-10 ENCOUNTER — Telehealth: Payer: Self-pay | Admitting: Nurse Practitioner

## 2018-06-10 DIAGNOSIS — R059 Cough, unspecified: Secondary | ICD-10-CM

## 2018-06-10 DIAGNOSIS — R05 Cough: Secondary | ICD-10-CM

## 2018-06-10 MED ORDER — BENZONATATE 200 MG PO CAPS: 200.0000 mg | ORAL_CAPSULE | Freq: Two times a day (BID) | ORAL | 0 refills | Status: DC | PRN

## 2018-06-10 NOTE — Telephone Encounter (Signed)
Patient is still experiencing a bad cough and would like something sent to the pharmacy. Patient was seen on Tuesday with Ronnald Collum, FNP.

## 2018-06-11 DIAGNOSIS — N946 Dysmenorrhea, unspecified: Secondary | ICD-10-CM | POA: Diagnosis not present

## 2018-06-11 DIAGNOSIS — Z6829 Body mass index (BMI) 29.0-29.9, adult: Secondary | ICD-10-CM | POA: Diagnosis not present

## 2018-06-11 NOTE — Telephone Encounter (Signed)
Patient aware.

## 2018-08-17 ENCOUNTER — Ambulatory Visit: Payer: BLUE CROSS/BLUE SHIELD | Admitting: Family Medicine

## 2018-08-17 ENCOUNTER — Encounter: Payer: Self-pay | Admitting: Family Medicine

## 2018-08-17 VITALS — BP 108/66 | HR 89 | Temp 98.8°F | Ht 63.0 in | Wt 163.8 lb

## 2018-08-17 DIAGNOSIS — Z87892 Personal history of anaphylaxis: Secondary | ICD-10-CM | POA: Diagnosis not present

## 2018-08-17 DIAGNOSIS — Z889 Allergy status to unspecified drugs, medicaments and biological substances status: Secondary | ICD-10-CM

## 2018-08-17 MED ORDER — EPINEPHRINE 0.3 MG/0.3ML IJ SOAJ: 0.3000 mg | Freq: Once | INTRAMUSCULAR | 1 refills | Status: DC | PRN

## 2018-08-17 NOTE — Progress Notes (Signed)
BP 108/66   Pulse 89   Temp 98.8 F (37.1 C) (Oral)   Ht 5\' 3"  (1.6 m)   Wt 163 lb 12.8 oz (74.3 kg)   BMI 29.02 kg/m    Subjective:    Patient ID: Jeanne Frederick, female    DOB: 1995/12/28, 22 y.o.   MRN: [id number]  HPI: Jeanne Frederick is a 22 y.o. female presenting on 08/17/2018 for Medication Refill (epi pen)   HPI Medication refill for EpiPen Patient is coming in for medication refill for EpiPen.  She has multiple allergies in her past with the worst of which is to red meat to which she had an anaphylaxis reaction and she keeps an EpiPen handy.  This was a few years ago and she has not had any since but she keeps the EpiPen handy just in case.  Her last EpiPen is expired and so that is why she is coming in to get a new one.  She gets her routine physicals with gynecology and has been scheduled for later this year and denies any other health issues today  Relevant past medical, surgical, family and social history reviewed and updated as indicated. Interim medical history since our last visit reviewed. Allergies and medications reviewed and updated.  Review of Systems  Constitutional: Negative for chills and fever.  Eyes: Negative for visual disturbance.  Respiratory: Negative for chest tightness and shortness of breath.   Cardiovascular: Negative for chest pain and leg swelling.  Musculoskeletal: Negative for back pain and gait problem.  Skin: Negative for rash.  Neurological: Negative for light-headedness and headaches.  Psychiatric/Behavioral: Negative for agitation and behavioral problems.  All other systems reviewed and are negative.   Per HPI unless specifically indicated above   Allergies as of 08/17/2018      Reactions   Lac Bovis Swelling, Other (See Comments)   Beef-derived Products    Alpha Gal Allergy   Other Other (See Comments)   Cannot take capsules of any kind d/t alpha gal disease   Pork-derived Products    Alpha Gal Allergy      Medication  List        Accurate as of 08/17/18  4:40 PM. Always use your most recent med list.          EPINEPHrine 0.3 mg/0.3 mL Soaj injection Commonly known as:  EPI-PEN Inject 0.3 mLs (0.3 mg total) into the muscle once as needed.   norethindrone-ethinyl estradiol 1-20 MG-MCG tablet Commonly known as:  JUNEL FE,GILDESS FE,LOESTRIN FE Take 1 tablet by mouth daily.          Objective:    BP 108/66   Pulse 89   Temp 98.8 F (37.1 C) (Oral)   Ht 5\' 3"  (1.6 m)   Wt 163 lb 12.8 oz (74.3 kg)   BMI 29.02 kg/m   Wt Readings from Last 3 Encounters:  08/17/18 163 lb 12.8 oz (74.3 kg)  06/08/18 159 lb (72.1 kg)  02/21/18 155 lb (70.3 kg)    Physical Exam  Constitutional: She is oriented to person, place, and time. She appears well-developed and well-nourished. No distress.  Eyes: Pupils are equal, round, and reactive to light. Conjunctivae and EOM are normal.  Neck: Neck supple. No thyromegaly present.  Cardiovascular: Normal rate, regular rhythm, normal heart sounds and intact distal pulses.  No murmur heard. Pulmonary/Chest: Effort normal and breath sounds normal. No respiratory distress. She has no wheezes.  Musculoskeletal: Normal range of motion. She exhibits no edema.  Lymphadenopathy:    She has no cervical adenopathy.  Neurological: She is alert and oriented to person, place, and time. Coordination normal.  Skin: Skin is warm and dry. No rash noted. She is not diaphoretic.  Psychiatric: She has a normal mood and affect. Her behavior is normal.  Nursing note and vitals reviewed.       Assessment & Plan:   Problem List Items Addressed This Visit    None    Visit Diagnoses    Multiple allergies    -  Primary   Relevant Medications   EPINEPHrine (EPIPEN 2-PAK) 0.3 mg/0.3 mL IJ SOAJ injection   History of anaphylaxis           Follow up plan: Return in about 1 year (around 08/18/2019), or if symptoms worsen or fail to improve.  Counseling provided for all of the  vaccine components No orders of the defined types were placed in this encounter.   Caryl Pina, MD [facility] 08/17/2018, 4:40 PM

## 2018-11-16 ENCOUNTER — Ambulatory Visit: Payer: BLUE CROSS/BLUE SHIELD | Admitting: Family Medicine

## 2018-11-16 ENCOUNTER — Encounter: Payer: Self-pay | Admitting: Family Medicine

## 2018-11-16 VITALS — BP 116/70 | HR 100 | Temp 98.2°F | Ht 63.0 in | Wt 161.0 lb

## 2018-11-16 DIAGNOSIS — R109 Unspecified abdominal pain: Secondary | ICD-10-CM | POA: Diagnosis not present

## 2018-11-16 MED ORDER — HYOSCYAMINE SULFATE SL 0.125 MG SL SUBL: 0.1250 mg | SUBLINGUAL_TABLET | Freq: Four times a day (QID) | SUBLINGUAL | 2 refills | Status: DC | PRN

## 2018-11-16 NOTE — Progress Notes (Signed)
Subjective:  Patient ID: Jeanne Frederick, female    DOB: Nov 15, 1995  Age: 23 y.o. MRN: [id number]  CC: Abdominal Pain (pt here today c/o stomach pains especially after eating and fatigue)   HPI [place] presents for onset 2 weeks ago of umbilical to epigastric discomfort.  After eating more than usual amounts of bread.  Since then every time she eats bread she develops abdominal discomfort.  This is described as a bloating with some cramping.  There is no burning.  Of note is that she has red meat intolerance.  At the time of onset she was eating a [place] sub.  She notes that during this time she is had increased numbers of bowel movements.  2-3 a day sometimes more.  They tend to be rather loose.  There has been no blood.  She is concerned about celiac/gluten intolerance.  Depression screen [facility] 2/9 11/16/2018 08/17/2018 06/08/2018  Decreased Interest 0 0 0  Down, Depressed, Hopeless 0 0 0  PHQ - 2 Score 0 0 0    History Jeanne Frederick has a past medical history of Allergy.   She has no past surgical history on file.   Her family history includes Diabetes in her maternal grandmother.She reports that she has never smoked. She has never used smokeless tobacco. She reports that she does not drink alcohol or use drugs.    ROS Review of Systems  Constitutional: Negative for fever.  HENT: Negative for congestion, rhinorrhea and sore throat.   Respiratory: Negative for cough and shortness of breath.   Cardiovascular: Negative for chest pain and palpitations.  Gastrointestinal: Positive for abdominal distention, diarrhea and nausea. Negative for abdominal pain, anal bleeding, blood in stool, constipation, rectal pain and vomiting.  Musculoskeletal: Negative for arthralgias and myalgias.    Objective:  BP 116/70   Pulse 100   Temp 98.2 F (36.8 C) (Oral)   Ht 5\' 3"  (1.6 m)   Wt 161 lb (73 kg)   BMI 28.52 kg/m   BP Readings from Last 3 Encounters:  11/16/18 116/70  08/17/18  108/66  06/08/18 131/74    Wt Readings from Last 3 Encounters:  11/16/18 161 lb (73 kg)  08/17/18 163 lb 12.8 oz (74.3 kg)  06/08/18 159 lb (72.1 kg)     Physical Exam Constitutional:      Appearance: She is well-developed.  HENT:     Head: Normocephalic and atraumatic.  Cardiovascular:     Rate and Rhythm: Normal rate and regular rhythm.     Heart sounds: No murmur.  Pulmonary:     Effort: Pulmonary effort is normal.     Breath sounds: Normal breath sounds.  Abdominal:     General: Bowel sounds are normal.     Palpations: Abdomen is soft. There is no mass.     Tenderness: There is abdominal tenderness in the epigastric area and periumbilical area. There is no guarding or rebound.  Skin:    General: Skin is warm and dry.  Neurological:     Mental Status: She is alert and oriented to person, place, and time.  Psychiatric:        Behavior: Behavior normal.       Assessment & Plan:   Jeanne Frederick was seen today for abdominal pain.  Diagnoses and all orders for this visit:  Abdominal pain, unspecified abdominal location -     Celiac Panel -     Gluten Sensitivity Screen  Other orders -     Hyoscyamine Sulfate  SL (LEVSIN/SL) 0.125 MG SUBL; Place 0.125 mg under the tongue 4 (four) times daily as needed (stomach pain).   Trial of gluten-free diet.  Handout given.    I am having Jeanne Frederick Perrone start on Hyoscyamine Sulfate SL. I am also having her maintain her norethindrone-ethinyl estradiol and EPINEPHrine.  Allergies as of 11/16/2018      Reactions   Lac Bovis Swelling, Other (See Comments)   Beef-derived Products    Alpha Gal Allergy   Other Other (See Comments)   Cannot take capsules of any kind d/t alpha gal disease   Pork-derived Products    Alpha Gal Allergy      Medication List       Accurate as of November 16, 2018  7:09 PM. Always use your most recent med list.        EPINEPHrine 0.3 mg/0.3 mL Soaj injection Commonly known as:  EPIPEN  2-PAK Inject 0.3 mLs (0.3 mg total) into the muscle once as needed.   Hyoscyamine Sulfate SL 0.125 MG Subl Commonly known as:  LEVSIN/SL Place 0.125 mg under the tongue 4 (four) times daily as needed (stomach pain).   norethindrone-ethinyl estradiol 1-20 MG-MCG tablet Commonly known as:  JUNEL FE,GILDESS FE,LOESTRIN FE Take 1 tablet by mouth daily.        Follow-up: No follow-ups on file.  Claretta Fraise, M.D.

## 2018-11-16 NOTE — Patient Instructions (Signed)
Gluten-Free Diet for Celiac Disease, Adult  The gluten-free diet includes all foods that do not contain gluten. Gluten is a protein that is found in wheat, rye, barley, and some other grains. Following the gluten-free diet is the only treatment for people with celiac disease. It helps to prevent damage to the intestines and improves or eliminates the symptoms of celiac disease. Following the gluten-free diet requires some planning. It can be challenging at first, but it gets easier with time and practice. There are more gluten-free options available today than ever before. If you need help finding gluten-free foods or if you have questions, talk with your diet and nutrition specialist (registered dietitian) or your health care provider. What do I need to know about a gluten-free diet?  All fruits, vegetables, and meats are safe to eat and do not contain gluten.  When grocery shopping, start by shopping in the produce, meat, and dairy sections. These sections are more likely to contain gluten-free foods. Then move to the aisles that contain packaged foods if you need to.  Read all food labels. Gluten is often added to foods. Always check the ingredient list and look for warnings, such as "may contain gluten."  Talk with your dietitian or health care provider before taking a gluten-free multivitamin or mineral supplement.  Be aware of gluten-free foods having contact with foods that contain gluten (cross-contamination). This can happen at home and with any processed foods. ? Talk with your health care provider or dietitian about how to reduce the risk of cross-contamination in your home. ? If you have questions about how a food is processed, ask the manufacturer. What key words help to identify gluten? Foods that list any of these key words on the label usually contain gluten:  Wheat, flour, enriched flour, bromated flour, white flour, durum flour, graham flour, phosphated flour, self-rising flour,  semolina, farina, barley (malt), rye, and oats.  Starch, dextrin, modified food starch, or cereal.  Thickening, fillers, or emulsifiers.  Malt flavoring, malt extract, or malt syrup.  Hydrolyzed vegetable protein. In the [place] packaged foods that are gluten-free are required to be labeled "GF." These foods should be easy to identify and are safe to eat. In the [place] food companies are also required to list common food allergens, including wheat, on their labels. Recommended foods Grains  Amaranth, bean flours, 100% buckwheat flour, corn, millet, nut flours or nut meals, GF oats, quinoa, rice, sorghum, teff, rice wafers, pure cornmeal tortillas, popcorn, and hot cereals made from cornmeal. Hominy, rice, wild rice. Some Asian rice noodles or bean noodles. Arrowroot starch, corn bran, corn flour, corn germ, cornmeal, corn starch, potato flour, potato starch flour, and rice bran. Plain, brown, and sweet rice flours. Rice polish, soy flour, and tapioca starch. Vegetables  All plain fresh, frozen, and canned vegetables. Fruits  All plain fresh, frozen, canned, and dried fruits, and 100% fruit juices. Meats and other protein foods  All fresh beef, pork, poultry, fish, seafood, and eggs. Fish canned in water, oil, brine, or vegetable broth. Plain nuts and seeds, peanut butter. Some lunch meat and some frankfurters. Dried beans, dried peas, and lentils. Dairy  Fresh plain, dry, evaporated, or condensed milk. Cream, butter, sour cream, whipping cream, and most yogurts. Unprocessed cheese, most processed cheeses, some cottage cheese, some cream cheeses. Beverages  Coffee, tea, most herbal teas. Carbonated beverages and some root beers. Wine, sake, and distilled spirits, such as gin, vodka, and whiskey. Most hard ciders. Fats and oils  Butter,   margarine, vegetable oil, hydrogenated butter, olive oil, shortening, lard, cream, and some mayonnaise. Some commercial salad dressings. Olives. Sweets and  desserts  Sugar, honey, some syrups, molasses, jelly, and jam. Plain hard candy, marshmallows, and gumdrops. Pure cocoa powder. Plain chocolate. Custard and some pudding mixes. Gelatin desserts, sorbets, frozen ice pops, and sherbet. Cake, cookies, and other desserts prepared with allowed flours. Some commercial ice creams. Cornstarch, tapioca, and rice puddings. Seasoning and other foods  Some canned or frozen soups. Monosodium glutamate (MSG). Cider, rice, and wine vinegar. Baking soda and baking powder. Cream of tartar. Baking and nutritional yeast. Certain soy sauces made without wheat (ask your dietitian about specific brands that are allowed). Nuts, coconut, and chocolate. Salt, pepper, herbs, spices, flavoring extracts, imitation or artificial flavorings, natural flavorings, and food colorings. Some medicines and supplements. Some lip glosses and other cosmetics. Rice syrups. The items listed may not be a complete list. Talk with your dietitian about what dietary choices are best for you. Foods to avoid Grains  Barley, bran, bulgur, couscous, cracked wheat, Melvin, farro, graham, malt, matzo, semolina, wheat germ, and all wheat and rye cereals including spelt and kamut. Cereals containing malt as a flavoring, such as rice cereal. Noodles, spaghetti, macaroni, most packaged rice mixes, and all mixes containing wheat, rye, barley, or triticale. Vegetables  Most creamed vegetables and most vegetables canned in sauces. Some commercially prepared vegetables and salads. Fruits  Thickened or prepared fruits and some pie fillings. Some fruit snacks and fruit roll-ups. Meats and other protein foods  Any meat or meat alternative containing wheat, rye, barley, or gluten stabilizers. These are often marinated or packaged meats and lunch meats. Bread-containing products, such as Swiss steak, croquettes, meatballs, and meatloaf. Most tuna canned in vegetable broth and turkey with hydrolyzed vegetable  protein (HVP) injected as part of the basting. Seitan. Imitation fish. Eggs in sauces made from ingredients to avoid. Dairy  Commercial chocolate milk drinks and malted milk. Some non-dairy creamers. Any cheese product containing ingredients to avoid. Beverages  Certain cereal beverages. Beer, ale, malted milk, and some root beers. Some hard ciders. Some instant flavored coffees. Some herbal teas made with barley or with barley malt added. Fats and oils  Some commercial salad dressings. Sour cream containing modified food starch. Sweets and desserts  Some toffees. Chocolate-coated nuts (may be rolled in wheat flour) and some commercial candies and candy bars. Most cakes, cookies, donuts, pastries, and other baked goods. Some commercial ice cream. Ice cream cones. Commercially prepared mixes for cakes, cookies, and other desserts. Bread pudding and other puddings thickened with flour. Products containing brown rice syrup made with barley malt enzyme. Desserts and sweets made with malt flavoring. Seasoning and other foods  Some curry powders, some dry seasoning mixes, some gravy extracts, some meat sauces, some ketchups, some prepared mustards, and horseradish. Certain soy sauces. Malt vinegar. Bouillon and bouillon cubes that contain HVP. Some chip dips, and some chewing gum. Yeast extract. Brewer's yeast. Caramel color. Some medicines and supplements. Some lip glosses and other cosmetics. The items listed may not be a complete list. Talk with your dietitian about what dietary choices are best for you. Summary  Gluten is a protein that is found in wheat, rye, barley, and some other grains. The gluten-free diet includes all foods that do not contain gluten.  If you need help finding gluten-free foods or if you have questions, talk with your diet and nutrition specialist (registered dietitian) or your health care provider.  Read   all food labels. Gluten is often added to foods. Always check the  ingredient list and look for warnings, such as "may contain gluten." This information is not intended to replace advice given to you by your health care provider. Make sure you discuss any questions you have with your health care provider. Document Released: 09/22/2005 Document Revised: 07/07/2016 Document Reviewed: 07/07/2016 Elsevier Interactive Patient Education  2019 [employer]  

## 2018-11-18 LAB — NOTE:

## 2018-11-18 LAB — GLIA (IGA/G) + TTG IGA
Antigliadin Abs, IgA: 4 units (ref 0–19)
Gliadin IgG: 2 units (ref 0–19)
Transglutaminase IgA: <2 U/mL (ref 0–3)

## 2018-11-18 LAB — GLUTEN SENSITIVITY SCREEN: tTG/DGP Screen: NEGATIVE

## 2018-11-18 LAB — ANTIGLIADIN IGG (NATIVE): Antigliadin IgG (native): 24 units — ABNORMAL HIGH (ref 0–19)

## 2018-11-19 ENCOUNTER — Telehealth: Payer: Self-pay | Admitting: Family Medicine

## 2018-11-19 NOTE — Telephone Encounter (Signed)
Please review lab results and advise 

## 2018-11-19 NOTE — Telephone Encounter (Signed)
Pt is calling to get her lab results.

## 2018-12-29 ENCOUNTER — Ambulatory Visit: Payer: BLUE CROSS/BLUE SHIELD | Admitting: Family Medicine

## 2018-12-29 ENCOUNTER — Other Ambulatory Visit: Payer: Self-pay

## 2018-12-29 ENCOUNTER — Encounter: Payer: Self-pay | Admitting: Family Medicine

## 2018-12-29 VITALS — BP 116/74 | HR 100 | Temp 98.7°F | Ht 63.0 in | Wt 161.8 lb

## 2018-12-29 DIAGNOSIS — K529 Noninfective gastroenteritis and colitis, unspecified: Secondary | ICD-10-CM

## 2018-12-29 NOTE — Progress Notes (Signed)
BP 116/74   Pulse 100   Temp 98.7 F (37.1 C) (Oral)   Ht 5\' 3"  (1.6 m)   Wt 161 lb 12.8 oz (73.4 kg)   BMI 28.66 kg/m    Subjective:   Patient ID: Jeanne Frederick, female    DOB: [birth year], 23 y.o.   MRN: [id number]  HPI: Jeanne Frederick is a 23 y.o. female presenting on 12/29/2018 for gluten allergy checkup (x 2 stomach ache)   HPI Stomach pains Patient comes in complaining of intermittent abdominal pains and diarrhea that is been going on for at least a few months.  She says she was seen 2 months ago during a really bad episode of it and they did some testing including testing for celiac disease which came back with 1 of the 4+ so in other words inconclusive.  She has been trying to do a gluten-free diet over the past couple months and she has had 2 flareups over the past couple months but they have gotten less severe on each 1 and less frequent than she was having them before.  She thinks she did notice both of flareups after eating out both times.  Patient denies any abdominal pain or nausea or vomiting or diarrhea today and has been doing well over the past couple weeks.  We discussed going to see gastroenterology versus continuing the diet any further and she would elect to continue the diet for now and see how it goes.  Relevant past medical, surgical, family and social history reviewed and updated as indicated. Interim medical history since our last visit reviewed. Allergies and medications reviewed and updated.  Review of Systems  Constitutional: Negative for chills and fever.  Eyes: Negative for visual disturbance.  Respiratory: Negative for chest tightness and shortness of breath.   Cardiovascular: Negative for chest pain and leg swelling.  Gastrointestinal: Positive for abdominal pain and diarrhea. Negative for abdominal distention, anal bleeding, blood in stool, constipation, nausea and vomiting.  Musculoskeletal: Negative for back pain and gait problem.  Skin:  Negative for rash.  Neurological: Negative for dizziness, light-headedness and headaches.  Psychiatric/Behavioral: Negative for agitation and behavioral problems.  All other systems reviewed and are negative.   Per HPI unless specifically indicated above   Allergies as of 12/29/2018      Reactions   Lac Bovis Swelling, Other (See Comments)   Beef-derived Products    Alpha Gal Allergy   Other Other (See Comments)   Cannot take capsules of any kind d/t alpha gal disease   Pork-derived Products    Alpha Gal Allergy      Medication List       Accurate as of December 29, 2018  4:29 PM. Always use your most recent med list.        EPINEPHrine 0.3 mg/0.3 mL Soaj injection Commonly known as:  EpiPen 2-Pak Inject 0.3 mLs (0.3 mg total) into the muscle once as needed.   norethindrone-ethinyl estradiol 1-20 MG-MCG tablet Commonly known as:  JUNEL FE,GILDESS FE,LOESTRIN FE Take 1 tablet by mouth daily.        Objective:   BP 116/74   Pulse 100   Temp 98.7 F (37.1 C) (Oral)   Ht 5\' 3"  (1.6 m)   Wt 161 lb 12.8 oz (73.4 kg)   BMI 28.66 kg/m   Wt Readings from Last 3 Encounters:  12/29/18 161 lb 12.8 oz (73.4 kg)  11/16/18 161 lb (73 kg)  08/17/18 163 lb 12.8 oz (74.3 kg)  Physical Exam Vitals signs and nursing note reviewed.  Constitutional:      General: She is not in acute distress.    Appearance: She is well-developed. She is not diaphoretic.  Eyes:     Conjunctiva/sclera: Conjunctivae normal.  Cardiovascular:     Rate and Rhythm: Normal rate and regular rhythm.     Heart sounds: Normal heart sounds. No murmur.  Pulmonary:     Effort: Pulmonary effort is normal. No respiratory distress.     Breath sounds: Normal breath sounds. No wheezing.  Abdominal:     General: Abdomen is flat. Bowel sounds are normal. There is no distension.     Tenderness: There is no abdominal tenderness. There is no right CVA tenderness, left CVA tenderness or guarding.   Musculoskeletal: Normal range of motion.        General: No tenderness.  Skin:    General: Skin is warm and dry.     Findings: No rash.  Neurological:     Mental Status: She is alert and oriented to person, place, and time.     Coordination: Coordination normal.  Psychiatric:        Behavior: Behavior normal.     Results for orders placed or performed in visit on 11/16/18  Celiac Panel  Result Value Ref Range   Antigliadin Abs, IgA 4 0 - 19 units   Gliadin IgG 2 0 - 19 units   Transglutaminase IgA <2 0 - 3 U/mL  Gluten Sensitivity Screen  Result Value Ref Range   tTG/DGP Screen Negative Negative  Antigliadin IgG (native)  Result Value Ref Range   Antigliadin IgG (native) 24 (H) 0 - 19 units  Note:  Result Value Ref Range   Note: Comment     Assessment & Plan:   Problem List Items Addressed This Visit    None    Visit Diagnoses    Inflammatory bowel disease    -  Primary   Concern for possible celiac's, came back inconclusive on testing, she is following diet has been improved, continue diet for now      Will follow diet for 6 months and if having issues will call back and discuss options in 6 months.  Gave education on following celiac's diet or gluten-free diet. Follow up plan: Return if symptoms worsen or fail to improve.  Counseling provided for all of the vaccine components No orders of the defined types were placed in this encounter.   Caryl Pina, MD [facility] 12/29/2018, 4:29 PM

## 2019-01-17 ENCOUNTER — Telehealth: Payer: Self-pay | Admitting: Family Medicine

## 2019-01-17 NOTE — Telephone Encounter (Signed)
Patient states she ate 1 bite of gluten 01/10/2019, and has had abdominal discomfort, and diarrhea 2-3 times per day since then.  She has been staying hydrated and is able to eat, but does not feel as hungry.  Advised patient that a flare of celiac's can last 1-2 weeks.  She would like to discuss with Dr. Warrick Parisian.  Advised her his first available appointment is 01/21/2019- patient states she will wait until then.  Offered her an earlier appointment with acute provider.  She declined.  Advised her to call back if symptoms worsen, or she needs a sooner appointment.  Patient agreeable.

## 2019-01-18 ENCOUNTER — Ambulatory Visit (INDEPENDENT_AMBULATORY_CARE_PROVIDER_SITE_OTHER): Payer: BLUE CROSS/BLUE SHIELD | Admitting: Family Medicine

## 2019-01-18 ENCOUNTER — Encounter: Payer: Self-pay | Admitting: Family Medicine

## 2019-01-18 ENCOUNTER — Other Ambulatory Visit: Payer: Self-pay

## 2019-01-18 DIAGNOSIS — K529 Noninfective gastroenteritis and colitis, unspecified: Secondary | ICD-10-CM | POA: Diagnosis not present

## 2019-01-18 MED ORDER — DICYCLOMINE HCL 20 MG PO TABS: 20.0000 mg | ORAL_TABLET | Freq: Four times a day (QID) | ORAL | 0 refills | Status: DC

## 2019-01-18 NOTE — Progress Notes (Signed)
Virtual Visit via telephone Note Due to COVID-19, visit is conducted virtually and was requested by patient.  I connected with Jeanne Frederick on 01/18/19 at 1530 by telephone and verified that I am speaking with the correct person using two identifiers. Jeanne Frederick is currently located at home and family is currently with them during visit. The provider, Monia Pouch, FNP is located in their office at time of visit.  I discussed the limitations, risks, security and privacy concerns of performing an evaluation and management service by telephone and the availability of in person appointments. I also discussed with the patient that there may be a patient responsible charge related to this service. The patient expressed understanding and agreed to proceed.  Subjective:  Patient ID: Jeanne Frederick, female    DOB: [birth year], 23 y.o.   MRN: [id number]  Chief Complaint:  Diarrhea   HPI: Jeanne Frederick is a 23 y.o. female presenting on 01/18/2019 for Diarrhea   Pt reports she has Celiac disease and ate gluten last Monday. Pt states she has had diarrhea since eating the gluten. Pt states she has tired over the counter imodium without relief of the diarrhea. Pt states she is able to hold liquids down. States she is just having 2-3 diarrhea stools per day with significant abdominal cramping. Pt states the cramping is intermittent and a 6/10 at times. She denies fever, chills, weakness, nausea, or vomiting. No confusion or decreased urine output.    Relevant past medical, surgical, family, and social history reviewed and updated as indicated.  Allergies and medications reviewed and updated.   Past Medical History:  Diagnosis Date  . Allergy     History reviewed. No pertinent surgical history.  Social History   Socioeconomic History  . Marital status: Married    Spouse name: Not on file  . Number of children: Not on file  . Years of education: Not on file  . Highest education  level: Not on file  Occupational History  . Not on file  Social Needs  . Financial resource strain: Not on file  . Food insecurity:    Worry: Not on file    Inability: Not on file  . Transportation needs:    Medical: Not on file    Non-medical: Not on file  Tobacco Use  . Smoking status: Never Smoker  . Smokeless tobacco: Never Used  Substance and Sexual Activity  . Alcohol use: No  . Drug use: No  . Sexual activity: Yes    Comment: married 06/2016  Lifestyle  . Physical activity:    Days per week: Not on file    Minutes per session: Not on file  . Stress: Not on file  Relationships  . Social connections:    Talks on phone: Not on file    Gets together: Not on file    Attends religious service: Not on file    Active member of club or organization: Not on file    Attends meetings of clubs or organizations: Not on file    Relationship status: Not on file  . Intimate partner violence:    Fear of current or ex partner: Not on file    Emotionally abused: Not on file    Physically abused: Not on file    Forced sexual activity: Not on file  Other Topics Concern  . Not on file  Social History Narrative  . Not on file    Outpatient Encounter Medications as of 01/18/2019  Medication Sig  .  dicyclomine (BENTYL) 20 MG tablet Take 1 tablet (20 mg total) by mouth every 6 (six) hours for 5 days.  Marland Kitchen EPINEPHrine (EPIPEN 2-PAK) 0.3 mg/0.3 mL IJ SOAJ injection Inject 0.3 mLs (0.3 mg total) into the muscle once as needed.  . norethindrone-ethinyl estradiol (JUNEL FE,GILDESS FE,LOESTRIN FE) 1-20 MG-MCG tablet Take 1 tablet by mouth daily.   No facility-administered encounter medications on file as of 01/18/2019.     Allergies  Allergen Reactions  . Lac Bovis Swelling and Other (See Comments)  . Beef-Derived Products     Alpha Gal Allergy   . Other Other (See Comments)    Cannot take capsules of any kind d/t alpha gal disease  . Pork-Derived Risk manager Gal Allergy     Review of Systems  Constitutional: Negative for activity change, appetite change, chills, fatigue, fever and unexpected weight change.  Respiratory: Negative for cough and shortness of breath.   Cardiovascular: Negative for chest pain, palpitations and leg swelling.  Gastrointestinal: Positive for abdominal pain and diarrhea. Negative for anal bleeding, blood in stool, constipation, nausea, rectal pain and vomiting.  Genitourinary: Negative for decreased urine volume and difficulty urinating.  Neurological: Negative for dizziness, syncope, weakness, light-headedness and headaches.  Psychiatric/Behavioral: Negative for confusion.  All other systems reviewed and are negative.        Observations/Objective: No vital signs or physical exam, this was a telephone or virtual health encounter.  Pt alert and oriented, answers all questions appropriately, and able to speak in full sentences.    Assessment and Plan: Jeanne Frederick was seen today for diarrhea.  Diagnoses and all orders for this visit:  Gastroenteritis Symptomatic care discussed. Adequate hydration discussed. Continue over the counter imodium as needed. Bland diet. Avoid gluten. Bentyl as needed for stomach cramps. Report any new or worsening symptoms. Follow up in one week if not better.  -     dicyclomine (BENTYL) 20 MG tablet; Take 1 tablet (20 mg total) by mouth every 6 (six) hours for 5 days.     Follow Up Instructions: Return in about 1 week (around 01/25/2019), or if symptoms worsen or fail to improve.    I discussed the assessment and treatment plan with the patient. The patient was provided an opportunity to ask questions and all were answered. The patient agreed with the plan and demonstrated an understanding of the instructions.   The patient was advised to call back or seek an in-person evaluation if the symptoms worsen or if the condition fails to improve as anticipated.  The above assessment and management plan was  discussed with the patient. The patient verbalized understanding of and has agreed to the management plan. Patient is aware to call the clinic if symptoms persist or worsen. Patient is aware when to return to the clinic for a follow-up visit. Patient educated on when it is appropriate to go to the emergency department.    I provided 15 minutes of non-face-to-face time during this encounter. The call started at 1530. The call ended at 1545.   Monia Pouch, FNP-C [facility] [address] [place], [place] [address] [phone]

## 2019-01-21 ENCOUNTER — Ambulatory Visit: Payer: Self-pay | Admitting: Family Medicine

## 2019-03-21 ENCOUNTER — Ambulatory Visit (INDEPENDENT_AMBULATORY_CARE_PROVIDER_SITE_OTHER): Payer: BC Managed Care – PPO | Admitting: Family Medicine

## 2019-03-21 ENCOUNTER — Encounter: Payer: Self-pay | Admitting: Family Medicine

## 2019-03-21 DIAGNOSIS — K529 Noninfective gastroenteritis and colitis, unspecified: Secondary | ICD-10-CM

## 2019-03-21 MED ORDER — MESALAMINE 400 MG PO CPDR: 800.0000 mg | DELAYED_RELEASE_CAPSULE | Freq: Three times a day (TID) | ORAL | 1 refills | Status: DC

## 2019-03-21 NOTE — Progress Notes (Signed)
Subjective:    Patient ID: Jeanne Frederick, female    DOB: [birth year], 23 y.o.   MRN: [id number]    HPI: Jeanne Frederick is a 23 y.o. female presenting for onset 3 days ago. Decreased appetite. Stomach hurting really bad. Five or more BM/ day. Sharp pains last a couple of minutes. PEriumbilical. Occur a couple of times per hour. BMs are loose. Has fecal urgency. Tenesmus with pushing. Denies blood in stool. No melena. Dark green or yellow orange color.   Depression screen [facility] 2/9 12/29/2018 11/16/2018 08/17/2018 06/08/2018 11/23/2017  Decreased Interest 0 0 0 0 0  Down, Depressed, Hopeless 0 0 0 0 0  PHQ - 2 Score 0 0 0 0 0     Relevant past medical, surgical, family and social history reviewed and updated as indicated.  Interim medical history since our last visit reviewed. Allergies and medications reviewed and updated.  ROS:  Review of Systems  Constitutional: Positive for appetite change (decreased). Negative for chills, diaphoresis and fever.  HENT: Negative for congestion.   Respiratory: Negative for shortness of breath.   Cardiovascular: Negative for chest pain.  Gastrointestinal: Positive for abdominal pain, diarrhea and rectal pain. Negative for constipation, nausea and vomiting.  Genitourinary: Negative for difficulty urinating.  Musculoskeletal: Negative for arthralgias and myalgias.  Neurological: Negative for headaches.  Psychiatric/Behavioral: Negative for sleep disturbance.     Social History   Tobacco Use  Smoking Status Never Smoker  Smokeless Tobacco Never Used       Objective:     Wt Readings from Last 3 Encounters:  12/29/18 161 lb 12.8 oz (73.4 kg)  11/16/18 161 lb (73 kg)  08/17/18 163 lb 12.8 oz (74.3 kg)     Exam deferred. Pt. Harboring due to COVID 19. Phone visit performed.   Assessment & Plan:   1. Inflammatory bowel disease     Meds ordered this encounter  Medications  . Mesalamine (ASACOL) 400 MG CPDR DR capsule    Sig: Take 2  capsules (800 mg total) by mouth 3 (three) times daily.    Dispense:  180 capsule    Refill:  1    Orders Placed This Encounter  Procedures  . Ambulatory referral to Gastroenterology    Referral Priority:   Routine    Referral Type:   Consultation    Referral Reason:   Specialty Services Required    Number of Visits Requested:   1      Diagnoses and all orders for this visit:  Inflammatory bowel disease -     Ambulatory referral to Gastroenterology  Other orders -     Mesalamine (ASACOL) 400 MG CPDR DR capsule; Take 2 capsules (800 mg total) by mouth 3 (three) times daily.    Virtual Visit via telephone Note  I discussed the limitations, risks, security and privacy concerns of performing an evaluation and management service by telephone and the availability of in person appointments. The patient was identified with two identifiers. Pt.expressed understanding and agreed to proceed. Pt. Is at home. Dr. Livia Snellen is in his office.  Follow Up Instructions:   I discussed the assessment and treatment plan with the patient. The patient was provided an opportunity to ask questions and all were answered. The patient agreed with the plan and demonstrated an understanding of the instructions.   The patient was advised to call back or seek an in-person evaluation if the symptoms worsen or if the condition fails to improve as anticipated.  Total minutes including chart review and phone contact time: 20   Follow up plan: Return if symptoms worsen or fail to improve.  Claretta Fraise, MD [place]

## 2019-03-25 ENCOUNTER — Other Ambulatory Visit: Payer: Self-pay

## 2019-03-25 ENCOUNTER — Ambulatory Visit (INDEPENDENT_AMBULATORY_CARE_PROVIDER_SITE_OTHER): Payer: BC Managed Care – PPO | Admitting: Gastroenterology

## 2019-03-25 VITALS — Ht 63.0 in | Wt 161.0 lb

## 2019-03-25 DIAGNOSIS — D649 Anemia, unspecified: Secondary | ICD-10-CM | POA: Diagnosis not present

## 2019-03-25 DIAGNOSIS — R768 Other specified abnormal immunological findings in serum: Secondary | ICD-10-CM | POA: Diagnosis not present

## 2019-03-25 DIAGNOSIS — K219 Gastro-esophageal reflux disease without esophagitis: Secondary | ICD-10-CM

## 2019-03-25 DIAGNOSIS — K529 Noninfective gastroenteritis and colitis, unspecified: Secondary | ICD-10-CM

## 2019-03-25 DIAGNOSIS — R194 Change in bowel habit: Secondary | ICD-10-CM | POA: Diagnosis not present

## 2019-03-25 DIAGNOSIS — R109 Unspecified abdominal pain: Secondary | ICD-10-CM | POA: Diagnosis not present

## 2019-03-25 NOTE — Progress Notes (Signed)
[facility] VISIT   Primary Care Provider Dettinger, Fransisca Kaufmann, MD [place] [address] [phone]  Referring Provider Dettinger, Fransisca Kaufmann, MD [address] [place],  [place] [address] [phone]  Patient Profile: Jeanne Frederick is a 23 y.o. female with a pmh significant for Food Allergies (Pork/Beef), Nephrolithiasis.  The patient presents to the [facility] for an evaluation and management of problem(s) noted below:  Problem List 1. IgG Gliadin antibody positive   2. Abdominal pain, unspecified abdominal location   3. Anemia, unspecified type   4. Change in bowel habit   5. Chronic diarrhea   6. Gastroesophageal reflux disease, esophagitis presence not specified     I connected with  [name] on 03/25/19. I verified that I was speaking with the correct person using two identifiers. Due to the COVID-19 Pandemic, this service was provided via telemedicine using Audio/Visual Media The patient was located in her car. The provider was located in the office. The patient did consent to this visit and is aware of charges through their insurance as well as the limitations of evaluation and management by telemedicine. The patient was referred by PCP Other persons participating in this telemedicine service were none. Time spent on visit was >45 minutes based on discussion with patient and in overall coordination of her care (labs/imaging/endoscopic evaluation).   History of Present Illness This is the patient's first visit to a Gastroenterologist.  The patient is adamant that she has celiac disease as she was told by a provider that she had that.  However in the work-up was last few months she is only had a moderately positive antigliadin antibody.  The patient states that since earlier in the year (20 beginning) began to experience significant abdominal pain as well as increased bowel movements and diarrhea.  She has had  fecal urgency but no tenesmus.  She is also noticed increased eructation.  Her bowel movements are green or brown but never bloody.  She underwent a trial of a gluten-free diet and subsequently had laboratories performed and this showed a positive antigliadin antibody but with negative TTG.  IgA was not sent and that level needs to be sent.  No family history of celiac disease.  She has been on a reported gluten-free diet after discussing this with the dietitian but she works well but has never had a formal sit down with dietary.  Is not clear that a gluten-free diet has helped her.  She feels that she is able to monitor her diet very closely because a few years ago she was diagnosed with a meat allergy and pork allergy.  She remembers eating a hamburger and subsequently having the development of hives as well as difficulty breathing that required an ED evaluation.  She in 2019 underwent blood laboratory evaluation that suggested a meat and pork allergy.  She is try to stay away from those as much as possible.  She noticed that when she ate a piece of cake recently which was not gluten-free she had worsening abdominal pain and diarrhea for 1 week.  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  She recalls that during Christmas time she was okay without significant issues.  Her abdominal pain is in the midepigastric region and it can occur prandially or postprandially or while fasting.  She has a history of prior kidney stone but is not clear that she ever passed some  but the pain improved.  These were noted on CT scan previously.  She recently saw her primary care office and she was started on mesalamine earlier this week but is only taken 2 days worth of medicines.  She has never had an upper or lower endoscopy.  She does not take significant nonsteroidals  GI Review of Systems Positive as above including very mild pyrosis (does not take  any PPI), nausea, bloating Negative for dysphagia, odynophagia, vomiting emesis, early satiety, melena, hematochezia  Review of Systems General: Denies fevers/chills/weight loss HEENT: Denies oral lesions Cardiovascular: Denies chest pain Pulmonary: Denies shortness of breath Gastroenterological: See HPI Genitourinary: Denies darkened urine Hematological: Denies easy bruising/bleeding Endocrine: Denies temperature intolerance Dermatological: Denies jaundice Psychological: Mood is stable Allergy & Immunology: As per her allergies listed Musculoskeletal: Denies new arthralgias   Medications Current Outpatient Medications  Medication Sig Dispense Refill   dicyclomine (BENTYL) 20 MG tablet Take 1 tablet (20 mg total) by mouth every 6 (six) hours for 5 days. 20 tablet 0   EPINEPHrine (EPIPEN 2-PAK) 0.3 mg/0.3 mL IJ SOAJ injection Inject 0.3 mLs (0.3 mg total) into the muscle once as needed. 1 Device 1   Mesalamine (ASACOL) 400 MG CPDR DR capsule Take 2 capsules (800 mg total) by mouth 3 (three) times daily. 180 capsule 1   norethindrone-ethinyl estradiol (JUNEL FE,GILDESS FE,LOESTRIN FE) 1-20 MG-MCG tablet Take 1 tablet by mouth daily.     No current facility-administered medications for this visit.     Allergies Allergies  Allergen Reactions   Lac Bovis Swelling and Other (See Comments)   Beef-Derived Products     Alpha Gal Allergy    Other Other (See Comments)    Cannot take capsules of any kind d/t alpha gal disease   Pork-Derived Products     Alpha Gal Allergy    Histories Past Medical History:  Diagnosis Date   Allergy    History reviewed. No pertinent surgical history. Social History   Socioeconomic History   Marital status: Married    Spouse name: Not on file   Number of children: Not on file   Years of education: Not on file   Highest education level: Not on file  Occupational History   Not on file  Social Needs   Financial resource strain:  Not on file   Food insecurity    Worry: Not on file    Inability: Not on file   Transportation needs    Medical: Not on file    Non-medical: Not on file  Tobacco Use   Smoking status: Never Smoker   Smokeless tobacco: Never Used  Substance and Sexual Activity   Alcohol use: No   Drug use: No   Sexual activity: Yes    Comment: married 06/2016  Lifestyle   Physical activity    Days per week: Not on file    Minutes per session: Not on file   Stress: Not on file  Relationships   Social connections    Talks on phone: Not on file    Gets together: Not on file    Attends religious service: Not on file    Active member of club or organization: Not on file    Attends meetings of clubs or organizations: Not on file    Relationship status: Not on file   Intimate partner violence    Fear of current or ex partner: Not on file    Emotionally abused: Not on file    Physically abused:  Not on file    Forced sexual activity: Not on file  Other Topics Concern   Not on file  Social History Narrative   Not on file   Family History  Problem Relation Age of Onset   Diabetes Maternal Grandmother    Colon cancer Neg Hx    Esophageal cancer Neg Hx    Inflammatory bowel disease Neg Hx    Liver disease Neg Hx    Pancreatic cancer Neg Hx    Rectal cancer Neg Hx    Stomach cancer Neg Hx    I have reviewed her medical, social, and family history in detail and updated the electronic medical record as necessary.    PHYSICAL EXAMINATION  Telehealth Visit   REVIEW OF DATA  I reviewed the following data at the time of this encounter:  GI Procedures and Studies  No relevant studies to review  Laboratory Studies  Reviewed in epic  Imaging Studies  May 2019 CT abdomen pelvis with contrast IMPRESSION: 1. RIGHT pyelonephritis. 2 mm distal RIGHT ureteral calculus versus phlebolith. RIGHT pelviectasis. 2. Punctate nonobstructing LEFT lower pole  nephrolithiasis   ASSESSMENT  Ms. Roat is a 23 y.o. female with a pmh significant for Food Allergies (Pork/Beef), Nephrolithiasis.  The patient is seen today for evaluation and management of:  1. IgG Gliadin antibody positive   2. Abdominal pain, unspecified abdominal location   3. Anemia, unspecified type   4. Change in bowel habit   5. Chronic diarrhea   6. Gastroesophageal reflux disease, esophagitis presence not specified    Patient has a multitude of GI manifestations.  The only positive celiac serology is a IgG antigliadin.  This is not enough to have a diagnosis of celiac disease.  Unfortunately, she has been on a gluten-free diet for the last few months.  Her not improving on a gluten-free diet would be suggestive that a diagnosis celiac disease seems less likely.  Even celiac sensitivity should have some improvement while on gluten-free diet.  Obviously, to make a true diagnosis we would need to consider biopsies via endoscopy.  I think based on her being on a gluten-free diet it would be reasonable to evaluate things by getting HLA DQ 8 haplotype to see.  If she does not have haplotype then he may celiac disease even less likely.  No matter what, she will benefit from a diagnostic upper endoscopy such that we can rule out other etiologies for abdominal pain.  With her diarrhea that has been ongoing do think a diagnostic colonoscopy as well will be reasonable.  We talked about considering a gluten challenge for the next few weeks prior to an endoscopy/colonoscopy and the patient think she can try that.  I told her if things worsen significantly when she restarted gluten then to stop and then we will just go ahead and move forward with the procedure even if she is on a gluten-free diet.  I think she may have manifestations of underlying functional bowel disorder however we need to rule things out further before we give that diagnosis.  I would like to send an alpha galactosidase evaluation to  query whether the patient really has a red meat allergy or not improved by previously.  The risks and benefits of endoscopic evaluation were discussed with the patient; these include but are not limited to the risk of perforation, infection, bleeding, missed lesions, lack of diagnosis, severe illness requiring hospitalization, as well as anesthesia and sedation related illnesses.  The patient is  agreeable to proceed.  We will get stool studies as well further evaluate her diarrhea.  All patient questions were answered, to the best of my ability, and the patient agrees to the aforementioned plan of action with follow-up as indicated.   PLAN  Laboratories as outlined below H. pylori stool antigen Stool studies as outlined below Gluten challenge for next 2 weeks prior to endoscopy/colonoscopy (obtain esophageal/gastric/duodenal/ileal/colon biopsies) CT abdomen pelvis to reevaluate history of nephrolithiasis and ensure no other etiologies are playing a role (can be performed before or after endoscopies Follow-up thereafter   Orders Placed This Encounter  Procedures   Helicobacter pylori special antigen   Ova and parasite examination   Fecal leukocytes   Celiac Disease HLA DQ Assoc.   Alpha-Gal Panel   CBC   Comp Met (CMET)   Amylase   Lipase   TSH   Cortisol   Sedimentation rate   CRP High sensitivity   IBC + Ferritin   B12   Folate   IgA   Clostridium difficile Toxin B, Qualitative, Real-Time PCR   Gastrointestinal Pathogen Panel PCR   Pancreatic Elastase, Fecal   Fecal fat, quantitative    New Prescriptions   No medications on file   Modified Medications   No medications on file    Planned Follow Up No follow-ups on file.   Justice Britain, MD [facility] Office # [phone]

## 2019-03-26 ENCOUNTER — Encounter: Payer: Self-pay | Admitting: Gastroenterology

## 2019-03-28 ENCOUNTER — Other Ambulatory Visit: Payer: Self-pay

## 2019-03-28 DIAGNOSIS — R768 Other specified abnormal immunological findings in serum: Secondary | ICD-10-CM

## 2019-03-28 DIAGNOSIS — K529 Noninfective gastroenteritis and colitis, unspecified: Secondary | ICD-10-CM

## 2019-03-28 DIAGNOSIS — R109 Unspecified abdominal pain: Secondary | ICD-10-CM

## 2019-03-28 DIAGNOSIS — D649 Anemia, unspecified: Secondary | ICD-10-CM

## 2019-03-28 DIAGNOSIS — R194 Change in bowel habit: Secondary | ICD-10-CM

## 2019-03-28 DIAGNOSIS — K219 Gastro-esophageal reflux disease without esophagitis: Secondary | ICD-10-CM

## 2019-03-28 MED ORDER — PEG 3350-KCL-NA BICARB-NACL 420 G PO SOLR: 4000.0000 mL | Freq: Once | ORAL | 0 refills | Status: AC

## 2019-03-28 NOTE — Progress Notes (Unsigned)
You have been scheduled for a CT scan of the abdomen and pelvis at [facility] ([address]---this is in the same building as Press [facility]).   You are scheduled on 04/13/2019 at 8:45am. You should arrive 15 minutes prior to your appointment time for registration. Please follow the written instructions below on the day of your exam:  WARNING: IF YOU ARE ALLERGIC TO IODINE/X-RAY DYE, PLEASE NOTIFY RADIOLOGY IMMEDIATELY AT [phone]! YOU WILL BE GIVEN A 13 HOUR PREMEDICATION PREP.  1) Do not eat or drink anything after 4:45am (4 hours prior to your test) 2) You have been given 2 bottles of oral contrast to drink. The solution may taste better if refrigerated, but do NOT add ice or any other liquid to this solution. Shake well before drinking.    Drink 1 bottle of contrast @ 6:45am (2 hours prior to your exam)  Drink 1 bottle of contrast @ 7:45am (1 hour prior to your exam)  You may take any medications as prescribed with a small amount of water, if necessary. If you take any of the following medications: METFORMIN, GLUCOPHAGE, GLUCOVANCE, AVANDAMET, RIOMET, FORTAMET, [place] MET, JANUMET, GLUMETZA or METAGLIP, you MAY be asked to HOLD this medication 48 hours AFTER the exam.  The purpose of you drinking the oral contrast is to aid in the visualization of your intestinal tract. The contrast solution may cause some diarrhea. Depending on your individual set of symptoms, you may also receive an intravenous injection of x-ray contrast/dye. Plan on being at [facility] for 30 minutes or longer, depending on the type of exam you are having performed.  This test typically takes 30-45 minutes to complete.  If you have any questions regarding your exam or if you need to reschedule, you may call the CT department at [phone] between the hours of 8:00 am and 5:00 pm, Monday-Friday.  ________________________________________________________________________

## 2019-03-30 ENCOUNTER — Other Ambulatory Visit (INDEPENDENT_AMBULATORY_CARE_PROVIDER_SITE_OTHER): Payer: BC Managed Care – PPO

## 2019-03-30 DIAGNOSIS — D649 Anemia, unspecified: Secondary | ICD-10-CM | POA: Diagnosis not present

## 2019-03-30 DIAGNOSIS — R768 Other specified abnormal immunological findings in serum: Secondary | ICD-10-CM

## 2019-03-30 DIAGNOSIS — R109 Unspecified abdominal pain: Secondary | ICD-10-CM | POA: Diagnosis not present

## 2019-03-30 DIAGNOSIS — R194 Change in bowel habit: Secondary | ICD-10-CM | POA: Diagnosis not present

## 2019-03-30 DIAGNOSIS — K529 Noninfective gastroenteritis and colitis, unspecified: Secondary | ICD-10-CM

## 2019-03-30 DIAGNOSIS — K219 Gastro-esophageal reflux disease without esophagitis: Secondary | ICD-10-CM

## 2019-03-30 LAB — IBC + FERRITIN
Ferritin: 32.8 ng/mL (ref 10.0–291.0)
Iron: 102 ug/dL (ref 42–145)
Saturation Ratios: 26.6 % (ref 20.0–50.0)
Transferrin: 274 mg/dL (ref 212.0–360.0)

## 2019-03-30 LAB — COMPREHENSIVE METABOLIC PANEL
ALT: 11 U/L (ref 0–35)
AST: 14 U/L (ref 0–37)
Albumin: 4.3 g/dL (ref 3.5–5.2)
Alkaline Phosphatase: 35 U/L — ABNORMAL LOW (ref 39–117)
BUN: 10 mg/dL (ref 6–23)
CO2: 27 mEq/L (ref 19–32)
Calcium: 9.1 mg/dL (ref 8.4–10.5)
Chloride: 104 mEq/L (ref 96–112)
Creatinine, Ser: 0.57 mg/dL (ref 0.40–1.20)
GFR: 130.65 mL/min (ref >60.00)
Glucose, Bld: 91 mg/dL (ref 70–99)
Potassium: 3.5 mEq/L (ref 3.5–5.1)
Sodium: 139 mEq/L (ref 135–145)
Total Bilirubin: 0.3 mg/dL (ref 0.2–1.2)
Total Protein: 7.1 g/dL (ref 6.0–8.3)

## 2019-03-30 LAB — IGA: IgA: 288 mg/dL (ref 68–378)

## 2019-03-30 LAB — HIGH SENSITIVITY CRP: CRP, High Sensitivity: 6.11 mg/L — ABNORMAL HIGH (ref 0.000–5.000)

## 2019-03-30 LAB — CBC
HCT: 37 % (ref 36.0–46.0)
Hemoglobin: 12.6 g/dL (ref 12.0–15.0)
MCHC: 34.2 g/dL (ref 30.0–36.0)
MCV: 92.9 fl (ref 78.0–100.0)
Platelets: 232 10*3/uL (ref 150.0–400.0)
RBC: 3.98 Mil/uL (ref 3.87–5.11)
RDW: 12.9 % (ref 11.5–15.5)
WBC: 8.8 10*3/uL (ref 4.0–10.5)

## 2019-03-30 LAB — LIPASE: Lipase: 27 U/L (ref 11.0–59.0)

## 2019-03-30 LAB — FOLATE: Folate: 11.5 ng/mL (ref >5.9)

## 2019-03-30 LAB — CORTISOL: Cortisol, Plasma: 17.7 ug/dL

## 2019-03-30 LAB — TSH: TSH: 0.94 u[IU]/mL (ref 0.35–4.50)

## 2019-03-30 LAB — VITAMIN B12: Vitamin B-12: 166 pg/mL — ABNORMAL LOW (ref 211–911)

## 2019-03-30 LAB — AMYLASE: Amylase: 42 U/L (ref 27–131)

## 2019-03-30 LAB — SEDIMENTATION RATE: Sed Rate: 4 mm/hr (ref 0–20)

## 2019-03-31 ENCOUNTER — Other Ambulatory Visit: Payer: Self-pay

## 2019-03-31 ENCOUNTER — Telehealth: Payer: Self-pay | Admitting: Gastroenterology

## 2019-03-31 DIAGNOSIS — D649 Anemia, unspecified: Secondary | ICD-10-CM

## 2019-03-31 MED ORDER — B-12 2000 MCG PO TABS: ORAL_TABLET | ORAL | 2 refills | Status: DC

## 2019-03-31 NOTE — Telephone Encounter (Signed)
Pt requested a prescription for B12.  °

## 2019-03-31 NOTE — Telephone Encounter (Signed)
Rx for B12 sent to patient's pharmacy. Pt informed.

## 2019-04-01 ENCOUNTER — Other Ambulatory Visit: Payer: BC Managed Care – PPO

## 2019-04-01 DIAGNOSIS — K219 Gastro-esophageal reflux disease without esophagitis: Secondary | ICD-10-CM

## 2019-04-01 DIAGNOSIS — K529 Noninfective gastroenteritis and colitis, unspecified: Secondary | ICD-10-CM

## 2019-04-01 DIAGNOSIS — R109 Unspecified abdominal pain: Secondary | ICD-10-CM

## 2019-04-01 DIAGNOSIS — R768 Other specified abnormal immunological findings in serum: Secondary | ICD-10-CM

## 2019-04-01 DIAGNOSIS — R194 Change in bowel habit: Secondary | ICD-10-CM

## 2019-04-01 DIAGNOSIS — D649 Anemia, unspecified: Secondary | ICD-10-CM

## 2019-04-01 NOTE — Addendum Note (Signed)
Addended by: Trenda Moots on: 2/55/2589 02:07 PM   Modules accepted: Orders

## 2019-04-06 LAB — RETICULOCYTES
ABS Retic: 46800 cells/uL (ref 20000–8000)
Retic Ct Pct: 1.2 %

## 2019-04-06 LAB — ALPHA-GAL PANEL
Beef IgE: 1.06 kU/L — ABNORMAL HIGH (ref <0.35)
Class: 1
Class: 2
Galactose-alpha-1,3-galactose IgE: 1.66 kU/L — ABNORMAL HIGH (ref <0.10)
LAMB/MUTTON IGE: 0.25 kU/L (ref <0.35)
Pork IgE: 0.5 kU/L — ABNORMAL HIGH (ref <0.35)

## 2019-04-07 DIAGNOSIS — Z87442 Personal history of urinary calculi: Secondary | ICD-10-CM | POA: Diagnosis not present

## 2019-04-07 DIAGNOSIS — R311 Benign essential microscopic hematuria: Secondary | ICD-10-CM | POA: Diagnosis not present

## 2019-04-07 DIAGNOSIS — R8271 Bacteriuria: Secondary | ICD-10-CM | POA: Diagnosis not present

## 2019-04-12 ENCOUNTER — Telehealth: Payer: Self-pay | Admitting: *Deleted

## 2019-04-12 LAB — CELIAC DISEASE HLA DQ ASSOC.
DQ2 (DQA1 0501/0505,DQB1 02XX): NEGATIVE
DQ8 (DQA1 03XX, DQB1 0302): POSITIVE

## 2019-04-12 NOTE — Telephone Encounter (Signed)

## 2019-04-13 ENCOUNTER — Other Ambulatory Visit: Payer: Self-pay

## 2019-04-13 ENCOUNTER — Ambulatory Visit (INDEPENDENT_AMBULATORY_CARE_PROVIDER_SITE_OTHER)
Admission: RE | Admit: 2019-04-13 | Discharge: 2019-04-13 | Disposition: A | Discharge status: Home / Self Care | Payer: BC Managed Care – PPO | Source: Ambulatory Visit | Attending: Gastroenterology | Admitting: Gastroenterology

## 2019-04-13 DIAGNOSIS — R194 Change in bowel habit: Secondary | ICD-10-CM

## 2019-04-13 DIAGNOSIS — R1013 Epigastric pain: Secondary | ICD-10-CM | POA: Diagnosis not present

## 2019-04-13 DIAGNOSIS — D649 Anemia, unspecified: Secondary | ICD-10-CM

## 2019-04-13 DIAGNOSIS — R768 Other specified abnormal immunological findings in serum: Secondary | ICD-10-CM | POA: Diagnosis not present

## 2019-04-13 DIAGNOSIS — K529 Noninfective gastroenteritis and colitis, unspecified: Secondary | ICD-10-CM

## 2019-04-13 DIAGNOSIS — N200 Calculus of kidney: Secondary | ICD-10-CM | POA: Diagnosis not present

## 2019-04-13 DIAGNOSIS — R109 Unspecified abdominal pain: Secondary | ICD-10-CM | POA: Diagnosis not present

## 2019-04-13 DIAGNOSIS — K219 Gastro-esophageal reflux disease without esophagitis: Secondary | ICD-10-CM

## 2019-04-13 MED ORDER — IOHEXOL 300 MG/ML  SOLN
100.0000 mL | Freq: Once | INTRAMUSCULAR | Status: AC | PRN
  Administered 2019-04-13: 100 mL via INTRAVENOUS

## 2019-04-14 ENCOUNTER — Telehealth: Payer: Self-pay

## 2019-04-14 DIAGNOSIS — K529 Noninfective gastroenteritis and colitis, unspecified: Secondary | ICD-10-CM

## 2019-04-14 DIAGNOSIS — R109 Unspecified abdominal pain: Secondary | ICD-10-CM

## 2019-04-14 DIAGNOSIS — R768 Other specified abnormal immunological findings in serum: Secondary | ICD-10-CM

## 2019-04-14 DIAGNOSIS — R194 Change in bowel habit: Secondary | ICD-10-CM

## 2019-04-14 DIAGNOSIS — D649 Anemia, unspecified: Secondary | ICD-10-CM

## 2019-04-14 DIAGNOSIS — K219 Gastro-esophageal reflux disease without esophagitis: Secondary | ICD-10-CM

## 2019-04-14 NOTE — Telephone Encounter (Signed)
Pt informed. Pt will have repeat GI pathogen in 4 weeks. Also pt will need repeat CRP (HS) as per Dr.Mansourarty's previous lab result note.

## 2019-04-14 NOTE — Telephone Encounter (Signed)
-----   Message from Irving Copas., MD sent at 04/14/2019  2:56 AM EDT ----- Helmut Muster, I released all of the results via my chart to the patient. Please order a repeat GI pathogen panel for the patient to do either before her colonoscopy or approximately 3 to 4 weeks after her colonoscopy is completed. Thank you. GM

## 2019-04-27 ENCOUNTER — Telehealth: Payer: Self-pay | Admitting: Gastroenterology

## 2019-04-27 NOTE — Telephone Encounter (Signed)
Patient answered "NO" to all Covid-19 questions.

## 2019-04-27 NOTE — Telephone Encounter (Signed)

## 2019-04-28 ENCOUNTER — Ambulatory Visit (AMBULATORY_SURGERY_CENTER): Payer: BC Managed Care – PPO | Admitting: Gastroenterology

## 2019-04-28 ENCOUNTER — Encounter: Payer: Self-pay | Admitting: Gastroenterology

## 2019-04-28 ENCOUNTER — Other Ambulatory Visit: Payer: Self-pay

## 2019-04-28 ENCOUNTER — Telehealth: Payer: Self-pay | Admitting: Gastroenterology

## 2019-04-28 VITALS — BP 131/83 | HR 82 | Temp 98.5°F | Resp 20 | Ht 63.0 in | Wt 161.0 lb

## 2019-04-28 DIAGNOSIS — R197 Diarrhea, unspecified: Secondary | ICD-10-CM

## 2019-04-28 DIAGNOSIS — K635 Polyp of colon: Secondary | ICD-10-CM | POA: Diagnosis not present

## 2019-04-28 DIAGNOSIS — D123 Benign neoplasm of transverse colon: Secondary | ICD-10-CM | POA: Diagnosis not present

## 2019-04-28 DIAGNOSIS — R1013 Epigastric pain: Secondary | ICD-10-CM

## 2019-04-28 DIAGNOSIS — R109 Unspecified abdominal pain: Secondary | ICD-10-CM

## 2019-04-28 DIAGNOSIS — Z1211 Encounter for screening for malignant neoplasm of colon: Secondary | ICD-10-CM | POA: Diagnosis not present

## 2019-04-28 DIAGNOSIS — R768 Other specified abnormal immunological findings in serum: Secondary | ICD-10-CM

## 2019-04-28 MED ORDER — SODIUM CHLORIDE 0.9 % IV SOLN: 500.0000 mL | Freq: Once | INTRAVENOUS | Status: DC

## 2019-04-28 NOTE — Telephone Encounter (Signed)
Pt is scheduled for EGD/col at 1:30 PM today but reported that she feels nauseous from the prep soln.

## 2019-04-28 NOTE — Patient Instructions (Signed)
Handout on polyp, hemorrhoids, high fiber diet.  Take sitz bath daily as needed.   YOU HAD AN ENDOSCOPIC PROCEDURE TODAY AT [facility]:   Refer to the procedure report that was given to you for any specific questions about what was found during the examination.  If the procedure report does not answer your questions, please call your gastroenterologist to clarify.  If you requested that your care partner not be given the details of your procedure findings, then the procedure report has been included in a sealed envelope for you to review at your convenience later.  YOU SHOULD EXPECT: Some feelings of bloating in the abdomen. Passage of more gas than usual.  Walking can help get rid of the air that was put into your GI tract during the procedure and reduce the bloating. If you had a lower endoscopy (such as a colonoscopy or flexible sigmoidoscopy) you may notice spotting of blood in your stool or on the toilet paper. If you underwent a bowel prep for your procedure, you may not have a normal bowel movement for a few days.  Please Note:  You might notice some irritation and congestion in your nose or some drainage.  This is from the oxygen used during your procedure.  There is no need for concern and it should clear up in a day or so.  SYMPTOMS TO REPORT IMMEDIATELY:   Following lower endoscopy (colonoscopy or flexible sigmoidoscopy):  Excessive amounts of blood in the stool  Significant tenderness or worsening of abdominal pains  Swelling of the abdomen that is new, acute  Fever of 100F or higher   Following upper endoscopy (EGD)  Vomiting of blood or coffee ground material  New chest pain or pain under the shoulder blades  Painful or persistently difficult swallowing  New shortness of breath  Fever of 100F or higher  Black, tarry-looking stools  For urgent or emergent issues, a gastroenterologist can be reached at any hour by calling [phone].   DIET:  We do  recommend a small meal at first, but then you may proceed to your regular diet.  Drink plenty of fluids but you should avoid alcoholic beverages for 24 hours.  ACTIVITY:  You should plan to take it easy for the rest of today and you should NOT DRIVE or use heavy machinery until tomorrow (because of the sedation medicines used during the test).    FOLLOW UP: Our staff will call the number listed on your records 48-72 hours following your procedure to check on you and address any questions or concerns that you may have regarding the information given to you following your procedure. If we do not reach you, we will leave a message.  We will attempt to reach you two times.  During this call, we will ask if you have developed any symptoms of COVID 19. If you develop any symptoms (ie: fever, flu-like symptoms, shortness of breath, cough etc.) before then, please call [phone].  If you test positive for Covid 19 in the 2 weeks post procedure, please call and report this information to [place].    If any biopsies were taken you will be contacted by phone or by letter within the next 1-3 weeks.  Please call us at [phone] if you have not heard about the biopsies in 3 weeks.    SIGNATURES/CONFIDENTIALITY: You and/or your care partner have signed paperwork which will be entered into your electronic medical record.  These signatures attest to the  fact that that the information above on your After Visit Summary has been reviewed and is understood.  Full responsibility of the confidentiality of this discharge information lies with you and/or your care-partner.

## 2019-04-28 NOTE — Progress Notes (Signed)
Pt's states no medical or surgical changes since previsit or office visit. 

## 2019-04-28 NOTE — Progress Notes (Signed)
A and O x3. Report to RN. Tolerated MAC anesthesia well.Teeth unchanged after procedure.

## 2019-04-28 NOTE — Progress Notes (Signed)
Called to room to assist during endoscopic procedure.  Patient ID and intended procedure confirmed with present staff. Received instructions for my participation in the procedure from the performing physician.  

## 2019-04-28 NOTE — Telephone Encounter (Signed)
Patient called that she was getting extremely nauseous while drinking her prep this am. I advised her to take a zofran if she had one which she thinks she does and to slow down on her drinking. I advised her to try and spread the prep out a little making sure that she did not have any liquid after 10:30. She thought she could do that and will call back if she needed to. She did say that her last BM was a pale yellow and all liquid so I explained that she was on the right track.

## 2019-04-28 NOTE — Op Note (Signed)
[name] Procedure Date: 04/28/2019 1:31 PM MRN: [id number] Endoscopist: Justice Britain , MD Age: 23 Referring MD:  Date of Birth: 09/22/1996 Gender: Female Account #: [id number] Procedure:                Upper GI endoscopy Indications:              Epigastric abdominal pain, Exclusion of celiac                            disease, Positive celiac serologies, Diarrhea Medicines:                Monitored Anesthesia Care Procedure:                Pre-Anesthesia Assessment:                           - Prior to the procedure, a History and Physical                            was performed, and patient medications and                            allergies were reviewed. The patient's tolerance of                            previous anesthesia was also reviewed. The risks                            and benefits of the procedure and the sedation                            options and risks were discussed with the patient.                            All questions were answered, and informed consent                            was obtained. Prior Anticoagulants: The patient has                            taken no previous anticoagulant or antiplatelet                            agents. ASA Grade Assessment: II - A patient with                            mild systemic disease. After reviewing the risks                            and benefits, the patient was deemed in                            satisfactory condition to undergo the procedure.  After obtaining informed consent, the endoscope was                            passed under direct vision. Throughout the                            procedure, the patient's blood pressure, pulse, and                            oxygen saturations were monitored continuously. The                            Endoscope was introduced through the mouth, and                             advanced to the third part of duodenum. The upper                            GI endoscopy was accomplished without difficulty.                            The patient tolerated the procedure. Scope In: Scope Out: Findings:                 No gross lesions were noted in the entire                            esophagus. Biopsies were taken with a cold forceps                            for histology for EoE.                           The Z-line was regular and was found 38 cm from the                            incisors.                           No gross lesions were noted in the entire examined                            stomach. Random biopsies were taken with a cold                            forceps for histology and Helicobacter pylori                            testing.                           No gross lesions were noted in the duodenal bulb,                            in the first portion  of the duodenum, in the second                            portion of the duodenum, in the major papilla and                            in the visualized third portion of the duodenum.                            Biopsies for histology were taken with a cold                            forceps for evaluation of celiac                            disease/enteropathy. Complications:            No immediate complications. Estimated Blood Loss:     Estimated blood loss was minimal. Impression:               - No gross lesions in esophagus. Biopsied for EoE.                           - Z-line regular, 38 cm from the incisors.                           - No gross lesions in the stomach. Biopsied for HP.                           - No gross lesions in the duodenal bulb, in the                            first portion of the duodenum, in the second                            portion of the duodenum, in the major papilla and                            in the third portion of the duodenum.  Biopsied. Recommendation:           - Proceed to scheduled colonoscopy.                           - Await pathology results.                           - Observe patient's clinical course.                           - The findings and recommendations were discussed                            with the patient. Justice Britain, MD 04/28/2019 2:13:18 PM

## 2019-04-28 NOTE — Op Note (Signed)
[place] Patient Name: Jeanne Frederick Procedure Date: 04/28/2019 1:31 PM MRN: [id number] Endoscopist: Justice Britain , MD Age: 23 Referring MD:  Date of Birth: 1996-01-03 Gender: Female Account #: [id number] Procedure:                Colonoscopy Indications:              Generalized abdominal pain, Clinically significant                            diarrhea of unexplained origin, Exclusion of                            ulcerative colitis Medicines:                Monitored Anesthesia Care Procedure:                Pre-Anesthesia Assessment:                           - Prior to the procedure, a History and Physical                            was performed, and patient medications and                            allergies were reviewed. The patient's tolerance of                            previous anesthesia was also reviewed. The risks                            and benefits of the procedure and the sedation                            options and risks were discussed with the patient.                            All questions were answered, and informed consent                            was obtained. Prior Anticoagulants: The patient has                            taken no previous anticoagulant or antiplatelet                            agents. ASA Grade Assessment: II - A patient with                            mild systemic disease. After reviewing the risks                            and benefits, the patient was deemed in  satisfactory condition to undergo the procedure.                           After obtaining informed consent, the colonoscope                            was passed under direct vision. Throughout the                            procedure, the patient's blood pressure, pulse, and                            oxygen saturations were monitored continuously. The                            Colonoscope was introduced through the  anus and                            advanced to the 8 cm into the ileum. The                            colonoscopy was performed without difficulty. The                            patient tolerated the procedure. The quality of the                            bowel preparation was excellent. The terminal                            ileum, ileocecal valve, appendiceal orifice, and                            rectum were photographed. Scope In: 1:50:57 PM Scope Out: 2:06:27 PM Scope Withdrawal Time: 0 hours 10 minutes 56 seconds  Total Procedure Duration: 0 hours 15 minutes 30 seconds  Findings:                 Small external skin tags were found on perianal                            exam.                           The digital rectal exam findings include                            hemorrhoids. Pertinent negatives include no                            palpable rectal lesions.                           The terminal ileum and ileocecal valve appeared  normal. Biopsies were taken with a cold forceps for                            histology to rule out IBD.                           A 3 mm polyp was found in the transverse colon. The                            polyp was sessile. The polyp was removed with a                            cold snare. Resection and retrieval were complete.                           Normal mucosa was found in the entire colon                            otherwise. Biopsies for histology were taken with a                            cold forceps from the cecum, ascending colon,                            transverse colon, descending colon, sigmoid colon,                            rectum and rectosigmoid colon for evaluation of                            microscopic colitis/chronic colitis rule out.                           Non-bleeding non-thrombosed external and internal                            hemorrhoids were found during retroflexion,  during                            perianal exam and during digital exam. The                            hemorrhoids were Grade II (internal hemorrhoids                            that prolapse but reduce spontaneously). Complications:            No immediate complications. Estimated Blood Loss:     Estimated blood loss was minimal. Impression:               - Perianal skin tags found on perianal exam.                            Hemorrhoids found on digital rectal exam.                           -  The examined portion of the ileum was normal.                            Biopsied.                           - One 3 mm polyp in the transverse colon, removed                            with a cold snare. Resected and retrieved.                           - Normal mucosa in the entire examined colon                            otherwise. Biopsied.                           - Non-bleeding non-thrombosed external and internal                            hemorrhoids. Recommendation:           - The patient will be observed post-procedure,                            until all discharge criteria are met.                           - Discharge patient to home.                           - Patient has a contact number available for                            emergencies. The signs and symptoms of potential                            delayed complications were discussed with the                            patient. Return to normal activities tomorrow.                            Written discharge instructions were provided to the                            patient.                           - High fiber diet.                           - Continue present medications.                           - Take Sitz baths Daily as needed.                           -  Await pathology results.                           - Repeat colonoscopy 7/10 years for surveillance                            based on pathology results if  findings of                            adenomatous tissue (based on current guidelines).                           - The findings and recommendations were discussed                            with the patient. Justice Britain, MD 04/28/2019 2:18:37 PM

## 2019-05-02 ENCOUNTER — Telehealth: Payer: Self-pay

## 2019-05-02 NOTE — Telephone Encounter (Signed)
  Follow up Call-  Call back number 04/28/2019  Post procedure Call Back phone  # [phone]  Permission to leave phone message Yes     Patient questions:  Do you have a fever, pain , or abdominal swelling? No. Pain Score  0 *  Have you tolerated food without any problems? Yes.    Have you been able to return to your normal activities? Yes.    Do you have any questions about your discharge instructions: Diet   No. Medications  No. Follow up visit  No.  Do you have questions or concerns about your Care? No.  Actions: * If pain score is 4 or above: No action needed, pain <4.  1. Have you developed a fever since your procedure? no  2.   Have you had an respiratory symptoms (SOB or cough) since your procedure? no  3.   Have you tested positive for COVID 19 since your procedure no  4.   Have you had any family members/close contacts diagnosed with the COVID 19 since your procedure?  no   If yes to any of these questions please route to Joylene John, RN and Alphonsa Gin, Therapist, sports.

## 2019-05-04 ENCOUNTER — Encounter: Payer: Self-pay | Admitting: Gastroenterology

## 2019-05-04 DIAGNOSIS — Z6829 Body mass index (BMI) 29.0-29.9, adult: Secondary | ICD-10-CM | POA: Diagnosis not present

## 2019-05-04 DIAGNOSIS — Z01419 Encounter for gynecological examination (general) (routine) without abnormal findings: Secondary | ICD-10-CM | POA: Diagnosis not present

## 2019-05-19 ENCOUNTER — Telehealth: Payer: Self-pay

## 2019-05-19 DIAGNOSIS — R109 Unspecified abdominal pain: Secondary | ICD-10-CM

## 2019-05-19 DIAGNOSIS — K529 Noninfective gastroenteritis and colitis, unspecified: Secondary | ICD-10-CM

## 2019-05-19 NOTE — Telephone Encounter (Signed)
You have been scheduled for an abdominal ultrasound at [facility] Radiology (1st floor of hospital) on 05/25/19 at 830 am. Please arrive 15 minutes prior to your appointment for registration. Make certain not to have anything to eat or drink 6 hours prior to your appointment. Should you need to reschedule your appointment, please contact radiology at [phone]. This test typically takes about 30 minutes to perform.  Follow up appt for 06/28/19 at 850 am.  The pt has been advised.

## 2019-05-19 NOTE — Telephone Encounter (Signed)
-----   Message from Irving Copas., MD sent at 05/19/2019  8:35 AM EDT ----- Regarding: Follow-up Jeanne Frederick,Please schedule the patient as per my MyChart message back for a right upper quadrant ultrasound to evaluate the gallbladder.We are not performing a HIDA scan.Lets get her back in clinic with me in the next 4 to 5 weeks if I do not have availability, then with 1 of our PAs.Thank you.GM

## 2019-05-23 ENCOUNTER — Telehealth: Payer: Self-pay | Admitting: Gastroenterology

## 2019-05-23 NOTE — Telephone Encounter (Signed)
Returned pt's call. Informed her that she would need repeat lab(GI pathogen panel) this week. Pt voiced understanding.

## 2019-05-25 ENCOUNTER — Ambulatory Visit (HOSPITAL_COMMUNITY): Payer: BC Managed Care – PPO

## 2019-05-26 ENCOUNTER — Ambulatory Visit (HOSPITAL_COMMUNITY)
Admission: RE | Admit: 2019-05-26 | Discharge: 2019-05-26 | Disposition: A | Discharge status: Home / Self Care | Payer: BC Managed Care – PPO | Source: Ambulatory Visit | Attending: Gastroenterology | Admitting: Gastroenterology

## 2019-05-26 ENCOUNTER — Other Ambulatory Visit: Payer: Self-pay

## 2019-05-26 ENCOUNTER — Other Ambulatory Visit (INDEPENDENT_AMBULATORY_CARE_PROVIDER_SITE_OTHER): Payer: BC Managed Care – PPO

## 2019-05-26 DIAGNOSIS — D649 Anemia, unspecified: Secondary | ICD-10-CM

## 2019-05-26 DIAGNOSIS — K529 Noninfective gastroenteritis and colitis, unspecified: Secondary | ICD-10-CM | POA: Diagnosis not present

## 2019-05-26 DIAGNOSIS — R109 Unspecified abdominal pain: Secondary | ICD-10-CM

## 2019-05-26 LAB — VITAMIN B12: Vitamin B-12: 614 pg/mL (ref 211–911)

## 2019-05-26 LAB — HIGH SENSITIVITY CRP: CRP, High Sensitivity: 8.54 mg/L — ABNORMAL HIGH (ref 0.000–5.000)

## 2019-05-30 ENCOUNTER — Other Ambulatory Visit: Payer: BC Managed Care – PPO

## 2019-05-30 DIAGNOSIS — D649 Anemia, unspecified: Secondary | ICD-10-CM

## 2019-05-30 DIAGNOSIS — K529 Noninfective gastroenteritis and colitis, unspecified: Secondary | ICD-10-CM | POA: Diagnosis not present

## 2019-05-30 DIAGNOSIS — R768 Other specified abnormal immunological findings in serum: Secondary | ICD-10-CM

## 2019-05-30 DIAGNOSIS — R109 Unspecified abdominal pain: Secondary | ICD-10-CM | POA: Diagnosis not present

## 2019-05-30 DIAGNOSIS — R194 Change in bowel habit: Secondary | ICD-10-CM

## 2019-05-30 DIAGNOSIS — K219 Gastro-esophageal reflux disease without esophagitis: Secondary | ICD-10-CM

## 2019-06-01 ENCOUNTER — Telehealth: Payer: Self-pay

## 2019-06-01 LAB — GASTROINTESTINAL PATHOGEN PANEL PCR
C. difficile Tox A/B, PCR: NOT DETECTED
Campylobacter, PCR: NOT DETECTED
Cryptosporidium, PCR: NOT DETECTED
E coli (ETEC) LT/ST PCR: NOT DETECTED
E coli (STEC) stx1/stx2, PCR: NOT DETECTED
E coli 0157, PCR: NOT DETECTED
Giardia lamblia, PCR: NOT DETECTED
Norovirus, PCR: NOT DETECTED
Rotavirus A, PCR: NOT DETECTED
Salmonella, PCR: NOT DETECTED
Shigella, PCR: NOT DETECTED

## 2019-06-01 NOTE — Telephone Encounter (Signed)
appt has been made for 9/22.  Pt aware

## 2019-06-01 NOTE — Telephone Encounter (Signed)
-----   Message from Timothy Lasso, RN sent at 05/04/2019  4:15 PM EDT ----- Regarding: FW: Follow-up  ----- Message ----- From: Irving Copas., MD Sent: 05/04/2019   4:10 PM EDT To: Timothy Lasso, RN Subject: Follow-up                                      Yitzchak Kothari,Lets get this patient set up for follow-up in approximately 3 to 6 weeks to discuss how she is doing and see how her symptoms are.  I have released the results of her biopsies via letters that will be sent by the [facility] Endo RN.Thanks.GM

## 2019-06-28 ENCOUNTER — Ambulatory Visit: Payer: BC Managed Care – PPO | Admitting: Gastroenterology

## 2019-07-01 ENCOUNTER — Ambulatory Visit: Payer: BLUE CROSS/BLUE SHIELD | Admitting: Family Medicine

## 2019-07-27 ENCOUNTER — Ambulatory Visit: Payer: BC Managed Care – PPO | Admitting: Family Medicine

## 2019-07-27 ENCOUNTER — Encounter: Payer: Self-pay | Admitting: Family Medicine

## 2019-07-27 ENCOUNTER — Other Ambulatory Visit: Payer: Self-pay

## 2019-07-27 VITALS — BP 114/72 | HR 104 | Temp 98.9°F | Resp 18 | Ht 63.0 in | Wt 165.0 lb

## 2019-07-27 DIAGNOSIS — Z889 Allergy status to unspecified drugs, medicaments and biological substances status: Secondary | ICD-10-CM

## 2019-07-27 DIAGNOSIS — L2084 Intrinsic (allergic) eczema: Secondary | ICD-10-CM

## 2019-07-27 MED ORDER — TRIAMCINOLONE ACETONIDE 0.1 % EX CREA: 1.0000 "application " | TOPICAL_CREAM | Freq: Two times a day (BID) | CUTANEOUS | 0 refills | Status: DC

## 2019-07-27 MED ORDER — AMOXICILLIN 875 MG PO TABS: 875.0000 mg | ORAL_TABLET | Freq: Two times a day (BID) | ORAL | 0 refills | Status: AC

## 2019-07-27 MED ORDER — EPINEPHRINE 0.3 MG/0.3ML IJ SOAJ: 0.3000 mg | Freq: Once | INTRAMUSCULAR | 1 refills | Status: DC | PRN

## 2019-07-27 NOTE — Progress Notes (Signed)
Chief Complaint  Patient presents with  . red area to lower left leg    HPI  Patient presents today for lesion on right shin that is irritated. Drained green puss this AM. Present for a few weeks. Started turning red. Itchy. No fever, cough, URI sx.  PMH: Smoking status noted ROS: Per HPI  Objective: BP 114/72   Pulse (!) 104   Temp 98.9 F (37.2 C) (Temporal)   Resp 18   Ht 5\' 3"  (1.6 m)   Wt 165 lb (74.8 kg)   SpO2 99%   BMI 29.23 kg/m  Gen: NAD, alert, cooperative with exam HEENT: NCAT, EOMI, PERRL Skin: Ovoid pink flat lesion with central scaling. It is 1.2X 1 cm at the mid shin region on the right.  Neuro: Alert and oriented, No gross deficits  Assessment and plan:  1. Intrinsic eczema   2. Multiple allergies     Meds ordered this encounter  Medications  . EPINEPHrine (EPIPEN 2-PAK) 0.3 mg/0.3 mL IJ SOAJ injection    Sig: Inject 0.3 mLs (0.3 mg total) into the muscle once as needed.    Dispense:  2 each    Refill:  1  . amoxicillin (AMOXIL) 875 MG tablet    Sig: Take 1 tablet (875 mg total) by mouth 2 (two) times daily for 10 days.    Dispense:  14 tablet    Refill:  0  . triamcinolone cream (KENALOG) 0.1 %    Sig: Apply 1 application topically 2 (two) times daily.    Dispense:  15 g    Refill:  0      Follow up as needed.  Claretta Fraise, MD

## 2019-07-28 ENCOUNTER — Telehealth: Payer: Self-pay | Admitting: *Deleted

## 2019-07-28 DIAGNOSIS — Z87892 Personal history of anaphylaxis: Secondary | ICD-10-CM

## 2019-07-28 NOTE — Telephone Encounter (Signed)
Prior Auth for Epi-Pen 2pak-In Process  Key: [id number]   Your information has been submitted to [place]. [employer] [place] will review the request and fax you a determination directly, typically within 3 business days of your submission once all necessary information is received.  If [employer] [place] has not responded in 3 business days or if you have any questions about your submission, contact [place] at [phone].

## 2019-08-01 MED ORDER — EPINEPHRINE 0.15 MG/0.15ML IJ SOAJ: 0.1500 mg | INTRAMUSCULAR | 3 refills | Status: DC | PRN

## 2019-08-01 NOTE — Telephone Encounter (Signed)
Tell her that I tried to send it to her as a generic and she can try this instead and we will try and run the prior Auth, I have associated the diagnosis of history of anaphylaxis

## 2019-08-01 NOTE — Telephone Encounter (Signed)
Prior Auth for Epi-pen-DENIED   This request has received a Unfavorable outcome.  Please see letter faxed to your office for details on this adverse benefit determination.  Please note any additional information provided by [facility] at the bottom of this request.

## 2019-08-02 NOTE — Telephone Encounter (Signed)
Spoke to pt and she was able to pick them up yesterday.

## 2019-09-03 NOTE — Progress Notes (Deleted)
09/03/2019 Jeanne Frederick [id number] [birth year]   History of Present Illness: Jeanne Frederick is a 23 year old female with a history of alpha gal allergy (beef and pork), positive IgG gliadin antibody positive. She underwent a virtual office visit with Dr. Rush Landmark on 03/25/2019. A gluten challenge for 2 weeks then EGD and colonoscopy were completed on 04/28/2019. No evidence of H. Pylori, celiac disease, microscopic colitis or IBD. A sessile serrated polyp was removed from the transverse colon.  GI panel negative 05/30/2019 CRP 8.54  CBC Latest Ref Rng & Units 03/30/2019 02/23/2018 02/22/2018  WBC 4.0 - 10.5 K/uL 8.8 8.1 13.2(H)  Hemoglobin 12.0 - 15.0 g/dL 12.6 11.1(L) 12.1  Hematocrit 36.0 - 46.0 % 37.0 35.0(L) 35.5(L)  Platelets 150.0 - 400.0 K/uL 232.0 207 246   CMP Latest Ref Rng & Units 03/30/2019 02/23/2018 02/22/2018  Glucose 70 - 99 mg/dL 91 104(H) 106(H)  BUN 6 - 23 mg/dL 10 5(L) 7  Creatinine 0.40 - 1.20 mg/dL 0.57 0.71 0.62  Sodium 135 - 145 mEq/L 139 139 140  Potassium 3.5 - 5.1 mEq/L 3.5 4.0 3.2(L)  Chloride 96 - 112 mEq/L 104 108 109  CO2 19 - 32 mEq/L 27 23 23   Calcium 8.4 - 10.5 mg/dL 9.1 8.5(L) 8.4(L)  Total Protein 6.0 - 8.3 g/dL 7.1 - -  Total Bilirubin 0.2 - 1.2 mg/dL 0.3 - -  Alkaline Phos 39 - 117 U/L 35(L) - -  AST 0 - 37 U/L 14 - -  ALT 0 - 35 U/L 11 - -      EGD 04/28/2019: - No gross lesions in esophagus. Biopsied for EoE. - Z-line regular, 38 cm from the incisors. - No gross lesions in the stomach. Biopsied for HP. - No gross lesions in the duodenal bulb, in the first portion of the duodenum, in the second portion of the duodenum, in the major papilla and in the third portion of the duodenum. Biopsied.  Colonoscopy 04/28/2019:  - Perianal skin tags found on perianal exam. Hemorrhoids found on digital rectal exam. - The examined portion of the ileum was normal. Biopsied. - One 3 mm sessile serrated polyp in the transverse colon, removed with a  cold snare. Resected and retrieved. - Normal mucosa in the entire examined colon otherwise. Biopsied. - Non-bleeding non-thrombosed external and internal hemorrhoids. -5 year recall      1. Surgical [P], duodenal - BENIGN DUODENAL MUCOSA - NO ACUTE INFLAMMATION, VILLOUS BLUNTING OR INCREASED INTRAEPITHELIAL LYMPHOCYTES IDENTIFIED 2. Surgical [P], gastric - BENIGN GASTRIC MUCOSA - NO H. PYLORI, INTESTINAL METAPLASIA OR MALIGNANCY IDENTIFIED 3. Surgical [P], esophageal - BENIGN SQUAMOUS MUCOSA - NO INCREASED INTRAEPITHELIAL EOSINOPHILS 4. Surgical [P], small bowel, terminal ileum - SMALL BOWEL MUCOSA WITH LYMPHOID AGGREGATES - NO ACUTE INFLAMMATION, GRANULOMAS OR MALIGNANCY IDENTIFIED 5. Surgical [P], colon, random - BENIGN COLONIC MUCOSA - NO ACTIVE INFLAMMATION OR EVIDENCE OF MICROSCOPIC COLITIS - NO HIGH GRADE DYSPLASIA OR MALIGNANCY IDENTIFIED 6. Surgical [P], colon, transverse, polyp - SESSILE SERRATED POLYP (1 OF 1 FRAGMENTS) - NO HIGH GRADE DYSPLASIA OR MALIGNANCY IDENTIFIED  Current Medications, Allergies, Past Medical History, Past Surgical History, Family History and Social History were reviewed in [employer] record.   Physical Exam: There were no vitals taken for this visit. General: Well developed  23 year old female in no acute distress Head: Normocephalic and atraumatic Eyes:  sclerae anicteric, conjunctiva pink  Ears: Normal auditory acuity Lungs: Clear throughout to auscultation Heart: Regular rate and rhythm  Abdomen: Soft, non tender and non distended. No masses, no hepatomegaly. Normal bowel sounds Rectal:  Musculoskeletal: Symmetrical with no gross deformities  Extremities: No edema  Neurological: Alert oriented x 4, grossly nonfocal Psychological:  Alert and cooperative. Normal mood and affect  Assessment and Recommendations:  1.    2.    3. Colon polyp, sessile serrated  -Next colonoscopy due July 2025

## 2019-09-05 ENCOUNTER — Ambulatory Visit: Payer: BC Managed Care – PPO | Admitting: Nurse Practitioner

## 2019-09-05 ENCOUNTER — Other Ambulatory Visit: Payer: Self-pay

## 2019-09-06 ENCOUNTER — Encounter: Payer: Self-pay | Admitting: Family Medicine

## 2019-09-06 ENCOUNTER — Other Ambulatory Visit: Payer: Self-pay | Admitting: Cardiology

## 2019-09-06 ENCOUNTER — Ambulatory Visit (INDEPENDENT_AMBULATORY_CARE_PROVIDER_SITE_OTHER): Payer: BC Managed Care – PPO | Admitting: Family Medicine

## 2019-09-06 DIAGNOSIS — Z20828 Contact with and (suspected) exposure to other viral communicable diseases: Secondary | ICD-10-CM | POA: Diagnosis not present

## 2019-09-06 DIAGNOSIS — J069 Acute upper respiratory infection, unspecified: Secondary | ICD-10-CM

## 2019-09-06 DIAGNOSIS — Z20822 Contact with and (suspected) exposure to covid-19: Secondary | ICD-10-CM

## 2019-09-06 NOTE — Progress Notes (Signed)
Virtual Visit via Telephone Note  I connected with Jeanne Frederick on 09/06/19 at 2:01 PM by telephone and verified that I am speaking with the correct person using two identifiers. Jeanne Frederick is currently located at home and nobody is currently with her during this visit. The provider, Loman Brooklyn, FNP is located in their office at time of visit.  I discussed the limitations, risks, security and privacy concerns of performing an evaluation and management service by telephone and the availability of in person appointments. I also discussed with the patient that there may be a patient responsible charge related to this service. The patient expressed understanding and agreed to proceed.  Subjective: PCP: Dettinger, Fransisca Kaufmann, MD  Chief Complaint  Patient presents with  . Cough   Patient complains of a dry cough, headache, dizziness and weakness. Additional symptoms include chest congestion, runny nose, sneezing, sore throat, ear pain/pressure, postnasal drainage, nausea and diarrhea. Onset of symptoms was 4 days ago, unchanged since that time. She is drinking plenty of fluids. Evaluation to date: none. Treatment to date: Mucinex DM and 3 tablets of Amoxicillin that she had left over. She does not have a history of asthma or COPD. She does not smoke. Patient did go this morning to get tested for COVID-19 at [facility]; she was told it would take 3-7 days to get her results back. She was also seen by the [facility] this morning. She is hoping to get a quicker COVID test for work purposes and for her husband's work.    ROS: Per HPI  Current Outpatient Medications:  .  EPINEPHrine 0.15 MG/0.15ML IJ injection, Inject 0.15 mLs (0.15 mg total) into the muscle as needed for anaphylaxis., Disp: 2 each, Rfl: 3 .  norethindrone-ethinyl estradiol (JUNEL FE,GILDESS FE,LOESTRIN FE) 1-20 MG-MCG tablet, Take 1 tablet by mouth daily., Disp: , Rfl:  .  triamcinolone cream (KENALOG) 0.1 %,  Apply 1 application topically 2 (two) times daily., Disp: 15 g, Rfl: 0  Allergies  Allergen Reactions  . Lac Bovis Swelling and Other (See Comments)  . Beef-Derived Products     Alpha Gal Allergy   . Other Other (See Comments)    Cannot take capsules of any kind d/t alpha gal disease  . Pork-Derived Risk manager Gal Allergy   Past Medical History:  Diagnosis Date  . Allergy     Observations/Objective: A&O  No respiratory distress or wheezing audible over the phone Mood, judgement, and thought processes all WNL  Assessment and Plan: 1. Upper respiratory tract infection, unspecified type - Discussed various testing sites with patient. Symptom management. Tylenol/Ibuprofen for pain/fever. Continue Mucinex for chest congestion.    Follow Up Instructions:  I discussed the assessment and treatment plan with the patient. The patient was provided an opportunity to ask questions and all were answered. The patient agreed with the plan and demonstrated an understanding of the instructions.   The patient was advised to call back or seek an in-person evaluation if the symptoms worsen or if the condition fails to improve as anticipated.  The above assessment and management plan was discussed with the patient. The patient verbalized understanding of and has agreed to the management plan. Patient is aware to call the clinic if symptoms persist or worsen. Patient is aware when to return to the clinic for a follow-up visit. Patient educated on when it is appropriate to go to the emergency department.   Time call ended: 2:12 PM  I provided 13 minutes of non-face-to-face time during this encounter.  Hendricks Limes, MSN, APRN, FNP-C [facility] 09/06/19

## 2019-09-08 LAB — NOVEL CORONAVIRUS, NAA: SARS-CoV-2, NAA: NOT DETECTED

## 2019-09-19 ENCOUNTER — Telehealth: Payer: Self-pay | Admitting: Gastroenterology

## 2019-09-19 NOTE — Telephone Encounter (Signed)
The pt has been rescheduled with Janett Billow for 12/21.

## 2019-09-23 ENCOUNTER — Ambulatory Visit: Payer: BC Managed Care – PPO | Admitting: Nurse Practitioner

## 2019-09-26 ENCOUNTER — Encounter: Payer: Self-pay | Admitting: Gastroenterology

## 2019-09-26 ENCOUNTER — Ambulatory Visit: Payer: BC Managed Care – PPO | Admitting: Gastroenterology

## 2019-09-26 VITALS — BP 124/68 | HR 70 | Temp 97.7°F | Ht 63.0 in | Wt 167.0 lb

## 2019-09-26 DIAGNOSIS — R0789 Other chest pain: Secondary | ICD-10-CM | POA: Diagnosis not present

## 2019-09-26 DIAGNOSIS — R1013 Epigastric pain: Secondary | ICD-10-CM

## 2019-09-26 DIAGNOSIS — R195 Other fecal abnormalities: Secondary | ICD-10-CM

## 2019-09-26 DIAGNOSIS — R12 Heartburn: Secondary | ICD-10-CM

## 2019-09-26 NOTE — Progress Notes (Signed)
     09/26/2019 Jeanne Frederick [id number] 1996-09-27   HISTORY OF PRESENT ILLNESS: This is a 23 year old female who is a patient of Dr. Donneta Romberg.  She's had several GI complaints and has undergone extensive evaluation with EGD, colonoscopy, CT scan, ultrasound, lab studies, stool studies, all which have been relatively unremarkable.  She presents here again today with complaints of intermittent epigastric abdominal pain.  This pain radiates down into her lower abdomen when it occurs.  Is also associated with severe heartburn and chest pain type sensation.  Symptoms occur a few times a week.  Feels completely normal between episodes.  Also describes loose stools intermittently.  She thinks that this might be a gallbladder issue.    Past Medical History:  Diagnosis Date  . Allergy    History reviewed. No pertinent surgical history.  reports that she has never smoked. She has never used smokeless tobacco. She reports that she does not drink alcohol or use drugs. family history includes Diabetes in her maternal grandmother. Allergies  Allergen Reactions  . Lac Bovis Swelling and Other (See Comments)  . Beef-Derived Products     Alpha Gal Allergy   . Other Other (See Comments)    Cannot take capsules of any kind d/t alpha gal disease  . Pork-Derived Products     Alpha Gal Allergy      Outpatient Encounter Medications as of 09/26/2019  Medication Sig  . EPINEPHrine 0.15 MG/0.15ML IJ injection Inject 0.15 mLs (0.15 mg total) into the muscle as needed for anaphylaxis.  Marland Kitchen norethindrone-ethinyl estradiol (JUNEL FE,GILDESS FE,LOESTRIN FE) 1-20 MG-MCG tablet Take 1 tablet by mouth daily.  . [DISCONTINUED] triamcinolone cream (KENALOG) 0.1 % Apply 1 application topically 2 (two) times daily.   No facility-administered encounter medications on file as of 09/26/2019.     REVIEW OF SYSTEMS  : All other systems reviewed and negative except where noted in the History of Present  Illness.   PHYSICAL EXAM: BP 124/68   Pulse 70   Temp 97.7 F (36.5 C) (Oral)   Ht 5\' 3"  (1.6 m)   Wt 167 lb (75.8 kg)   BMI 29.58 kg/m  General: Well developed white female in no acute distress Head: Normocephalic and atraumatic Eyes:  Sclerae anicteric, conjunctiva pink. Ears: Normal auditory acuity Lungs: Clear throughout to auscultation; no increased WOB. Heart: Regular rate and rhythm; no M/R/G. Abdomen: Soft, non-distended.  BS present.  Non-tender. Musculoskeletal: Symmetrical with no gross deformities  Skin: No lesions on visible extremities Extremities: No edema  Neurological: Alert oriented x 4, grossly non-focal Psychological:  Alert and cooperative. Normal mood and affect  ASSESSMENT AND PLAN: *23 year old female with complaints of intermittent epigastric abdominal pain, atypical chest pain, loose stools who is here today with ongoing complaints.  She has had extensive evaluation including stool studies, lab studies, CT scan, ultrasound, EGD, colonoscopy.  She sounds convinced that this is a gallbladder issue.  I think this more likely represents irritable bowel syndrome and I explained this to her.  We will schedule HIDA scan, but emphasized that if this is normal then we are likely dealing with a diagnosis of irritable bowel syndrome and treatment is aimed at managing symptoms.  CC:  Dettinger, Fransisca Kaufmann, MD

## 2019-09-26 NOTE — Progress Notes (Signed)
Attending Physician's Attestation   I have reviewed the chart.   I agree with the Advanced Practitioner's note, impression, and recommendations with any updates as below.  If HIDA is unremarkable, which I expect it would be, then I agree that underlying functional bowel disorder is most likely.  However, if she insists and wants to be seen by one of the surgeons to discuss cholecystectomy, I think it is reasonable for her to have that opportunity to talk with them.  Let us see how the HIDA returns and then can move forward with referral if positive and if patient still wants and the HIDA is negative then we will still plan to proceed with referral.  Consideration of a TCA at some point in time in the future will be made depending on final results.  Justice Britain, MD [facility] # [id number]

## 2019-09-26 NOTE — Patient Instructions (Signed)
You have been scheduled for a HIDA scan at [facility] (1st floor) on 10/03/19. Please arrive 15 minutes prior to your scheduled appointment at  [id number]. Make certain not to have anything to eat or drink at least 6 hours prior to your test. Should this appointment date or time not work well for you, please call radiology scheduling at [phone].  _____________________________________________________________________ hepatobiliary (HIDA) scan is an imaging procedure used to diagnose problems in the liver, gallbladder and bile ducts. In the HIDA scan, a radioactive chemical or tracer is injected into a vein in your arm. The tracer is handled by the liver like bile. Bile is a fluid produced and excreted by your liver that helps your digestive system break down fats in the foods you eat. Bile is stored in your gallbladder and the gallbladder releases the bile when you eat a meal. A special nuclear medicine scanner (gamma camera) tracks the flow of the tracer from your liver into your gallbladder and small intestine.  During your HIDA scan  You'll be asked to change into a hospital gown before your HIDA scan begins. Your health care team will position you on a table, usually on your back. The radioactive tracer is then injected into a vein in your arm.The tracer travels through your bloodstream to your liver, where it's taken up by the bile-producing cells. The radioactive tracer travels with the bile from your liver into your gallbladder and through your bile ducts to your small intestine.You may feel some pressure while the radioactive tracer is injected into your vein. As you lie on the table, a special gamma camera is positioned over your abdomen taking pictures of the tracer as it moves through your body. The gamma camera takes pictures continually for about an hour. You'll need to keep still during the HIDA scan. This can become uncomfortable, but you may find that you can lessen the discomfort by  taking deep breaths and thinking about other things. Tell your health care team if you're uncomfortable. The radiologist will watch on a computer the progress of the radioactive tracer through your body. The HIDA scan may be stopped when the radioactive tracer is seen in the gallbladder and enters your small intestine. This typically takes about an hour. In some cases extra imaging will be performed if original images aren't satisfactory, if morphine is given to help visualize the gallbladder or if the medication CCK is given to look at the contraction of the gallbladder. This test typically takes 2 hours to complete. ________________________________________________________________________

## 2019-10-03 ENCOUNTER — Other Ambulatory Visit: Payer: Self-pay

## 2019-10-03 ENCOUNTER — Ambulatory Visit (HOSPITAL_COMMUNITY)
Admission: RE | Admit: 2019-10-03 | Discharge: 2019-10-03 | Disposition: A | Discharge status: Home / Self Care | Payer: BC Managed Care – PPO | Source: Ambulatory Visit | Attending: Gastroenterology | Admitting: Gastroenterology

## 2019-10-03 DIAGNOSIS — R12 Heartburn: Secondary | ICD-10-CM | POA: Diagnosis not present

## 2019-10-03 DIAGNOSIS — R1013 Epigastric pain: Secondary | ICD-10-CM

## 2019-10-03 DIAGNOSIS — R0789 Other chest pain: Secondary | ICD-10-CM | POA: Diagnosis not present

## 2019-10-03 DIAGNOSIS — R195 Other fecal abnormalities: Secondary | ICD-10-CM

## 2019-10-03 MED ORDER — TECHNETIUM TC 99M MEBROFENIN IV KIT
5.3000 | PACK | Freq: Once | INTRAVENOUS | Status: AC | PRN
  Administered 2019-10-03: 5.3 via INTRAVENOUS

## 2019-10-18 ENCOUNTER — Ambulatory Visit: Payer: Self-pay | Admitting: Surgery

## 2019-10-18 NOTE — H&P (View-Only) (Signed)
History of Present Illness Jeanne Frederick. Jeanne Moorer MD; 10/18/2019 5:08 PM) The patient is a 24 year old female who presents with abdominal pain. Referred by Dr. 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 Her symptoms tend to be postprandial. Her pain is associated with a burning sensation heading up into her lower chest as well as nausea, bloating, and copious diarrhea. She denies any vomiting. Her symptoms tend to get worse with greasy foods. The patient has had a very extensive GI workup over the last several months. Gluten sensitivity was ruled out. Stool studies, CT scan, ultrasound, EGD, colonoscopy were all unremarkable. She had a HIDA scan performed on 10/03/19 that showed a low normal ejection fraction of 46% with normal greater than 33%. She did have some mild symptoms proximally 30 minutes after her study after ingestion of the Ensure. The patient is frustrated by the lack of an answer for her continued symptoms.  Upon further questioning of the patient, she has not really changed her diet. She continues to eat a fair amount of fast food chicken as well as [place] burgers. She has not really lost any weight over the last several months despite his continued symptoms. Her father had his gallbladder removed and reportedly did not have gallstones. Due to the lack of answers, the patient has been referred to me to discuss elective cholecystectomy based on her symptoms alone. She currently is not on any GI medications.  In June 2020, liver function tests, lipase, amylase were all normal.  CLINICAL DATA: Mid epigastric pain. Anemia.  EXAM: CT ABDOMEN AND PELVIS WITH CONTRAST  TECHNIQUE: Multidetector CT imaging of the abdomen and pelvis was performed using the standard protocol following bolus  administration of intravenous contrast.  CONTRAST: 183mL OMNIPAQUE IOHEXOL 300 MG/ML SOLN  COMPARISON: 02/21/2018  FINDINGS: Lower chest: Unremarkable.  Hepatobiliary: No suspicious focal abnormality within the liver parenchyma. There is no evidence for gallstones, gallbladder wall thickening, or pericholecystic fluid. No intrahepatic or extrahepatic biliary dilation.  Pancreas: No focal mass lesion. No dilatation of the main duct. No intraparenchymal cyst. No peripancreatic edema.  Spleen: No splenomegaly. No focal mass lesion.  Adrenals/Urinary Tract: No adrenal nodule or mass. Right kidney unremarkable. Punctate nonobstructing stone identified lower pole left kidney (image 45/coronal series 5). No evidence for hydroureter. The urinary bladder appears normal for the degree of distention.  Stomach/Bowel: Stomach is unremarkable. No gastric wall thickening. No evidence of outlet obstruction. Duodenum is normally positioned as is the ligament of Treitz. No small bowel wall thickening. No small bowel dilatation. The terminal ileum is normal. The appendix is normal. Colon is decompressed.  Vascular/Lymphatic: No abdominal aortic aneurysm. No abdominal aortic atherosclerotic calcification. There is no gastrohepatic or hepatoduodenal ligament lymphadenopathy. No intraperitoneal or retroperitoneal lymphadenopathy. No pelvic sidewall lymphadenopathy.  Reproductive: The uterus is unremarkable. There is no adnexal mass.  Other: No intraperitoneal free fluid.  Musculoskeletal: No worrisome lytic or sclerotic osseous abnormality.  IMPRESSION: 1. No acute findings in the abdomen or pelvis. No findings to explain the patient's history of pain. 2. Punctate nonobstructing stone lower pole left kidney.   Electronically Signed By: Jeanne Frederick M.D. On: 04/13/2019 09:29  CLINICAL DATA: Abdominal pain  EXAM: ABDOMEN ULTRASOUND COMPLETE  COMPARISON: 02/21/2018  CT  FINDINGS: Gallbladder: No gallstones or wall thickening visualized. No sonographic Murphy sign noted by  sonographer.  Common bile duct: Diameter: Normal caliber, 1 mm  Liver: No focal lesion identified. Within normal limits in parenchymal echogenicity. Portal vein is patent on color Doppler imaging with normal direction of blood flow towards the liver.  IVC: No abnormality visualized.  Pancreas: Visualized portion unremarkable.  Spleen: Size and appearance within normal limits.  Right Kidney: Length: 11.5 cm. Echogenicity within normal limits. No mass or hydronephrosis visualized.  Left Kidney: Length: 11.5 cm. Echogenicity within normal limits. No mass or hydronephrosis visualized.  Abdominal aorta: No aneurysm visualized.  Other findings: None.  IMPRESSION: Normal abdominal ultrasound.   Electronically Signed By: Jeanne Frederick M.D. On: 05/26/2019 13:20  CLINICAL DATA: Epigastric pain, loose stools, heartburn  EXAM: NUCLEAR MEDICINE HEPATOBILIARY IMAGING WITH GALLBLADDER EF  TECHNIQUE: Sequential images of the abdomen were obtained out to 60 minutes following intravenous administration of radiopharmaceutical. After oral ingestion of Ensure, gallbladder ejection fraction was determined. At 60 min, normal ejection fraction is greater than 33%.  RADIOPHARMACEUTICALS: 5.3 mCi Tc-19m Choletec IV  COMPARISON: None.  FINDINGS: Prompt uptake and biliary excretion of activity by the liver is seen. Gallbladder activity is visualized, consistent with patency of cystic duct. Biliary activity passes into small bowel, consistent with patent common bile duct.  Calculated gallbladder ejection fraction is 46%. (Normal gallbladder ejection fraction with Ensure is greater than 33%.)  IMPRESSION: 1. Normal hepatobiliary scan. No biliary obstruction. 2. Normal gallbladder ejection fraction.   Electronically Signed By: Jeanne Frederick On: 10/03/2019  11:38   Problem List/Past Medical Jeanne Key K. Jeanne Kaczynski, MD; 10/18/2019 5:08 PM) EPIGASTRIC PAIN (R10.13)  POSTPRANDIAL DIARRHEA (K52.9)   Past Surgical History Jeanne Frederick, Jeanne Frederick; 10/18/2019 1:50 PM) Colon Polyp Removal - Colonoscopy   Diagnostic Studies History Jeanne Frederick, CMA; 10/18/2019 1:50 PM) Colonoscopy  within last year  Allergies Jeanne Frederick, CMA; 10/18/2019 1:53 PM) Beef (Diagnostic) *DIAGNOSTIC PRODUCTS*  Allergic to Red Meat Allergies Reconciled   Medication History Jeanne Frederick, CMA; 10/18/2019 1:51 PM) EPINEPHrine (0.3MG /0.3ML Soln Auto-inj, Injection) Active. Junel 1.5/30 (1.5-30MG -MCG Tablet, Oral) Active.  Social History Jeanne Frederick, [place]; 10/18/2019 1:50 PM) Caffeine use  Carbonated beverages. Tobacco use  Never smoker.  Family History Jeanne Frederick, [place]; 10/18/2019 1:50 PM) Anesthetic complications  Father. Colon Polyps  Father. Thyroid problems  Mother.  Pregnancy / Birth History Jeanne Frederick, [place]; 10/18/2019 1:50 PM) Age at menarche  52 years. Contraceptive History  Oral contraceptives. Gravida  0 Para  0  Other Problems Jeanne Frederick. Georgette Dover, MD; 10/18/2019 5:08 PM) Kidney Joaquim Lai     Review of Systems Jeanne Frederick CMA; 10/18/2019 1:50 PM) General Present- Night Sweats. Not Present- Appetite Loss, Chills, Fatigue, Fever, Weight Gain and Weight Loss. Skin Present- Dryness. Not Present- Change in Wart/Mole, Hives, Jaundice, New Lesions, Non-Healing Wounds, Rash and Ulcer. HEENT Present- Wears glasses/contact lenses. Not Present- Earache, Hearing Loss, Hoarseness, Nose Bleed, Oral Ulcers, Ringing in the Ears, Seasonal Allergies, Sinus Pain, Sore Throat, Visual Disturbances and Yellow Eyes. Respiratory Not Present- Bloody sputum, Chronic Cough, Difficulty Breathing, Snoring and Wheezing. Breast Not Present- Breast Mass, Breast Pain, Nipple Discharge and Skin Changes. Cardiovascular Not Present- Chest Pain, Difficulty  Breathing Lying Down, Leg Cramps, Palpitations, Rapid Heart Rate, Shortness of Breath and Swelling of Extremities. Gastrointestinal Present- Abdominal Pain, Chronic diarrhea, Constipation and Gets full quickly at meals. Not Present- Bloating, Bloody Stool, Change in Bowel Habits, Difficulty Swallowing, Excessive gas, Hemorrhoids, Indigestion, Nausea, Rectal Pain and Vomiting. Female Genitourinary Not Present- Frequency, Nocturia, Painful Urination, Pelvic Pain and Urgency. Musculoskeletal Not Present- Back  Pain, Joint Pain, Joint Stiffness, Muscle Pain, Muscle Weakness and Swelling of Extremities. Neurological Not Present- Decreased Memory, Fainting, Headaches, Numbness, Seizures, Tingling, Tremor, Trouble walking and Weakness. Psychiatric Not Present- Anxiety, Bipolar, Change in Sleep Pattern, Depression, Fearful and Frequent crying. Endocrine Present- Cold Intolerance and Hot flashes. Not Present- Excessive Hunger, Hair Changes, Heat Intolerance and New Diabetes. Hematology Not Present- Blood Thinners, Easy Bruising, Excessive bleeding, Gland problems, HIV and Persistent Infections.  Vitals Jeanne Frederick CMA; 10/18/2019 1:50 PM) 10/18/2019 1:50 PM Weight: 167.8 lb Height: 63in Body Surface Area: 1.79 m Body Mass Index: 29.72 kg/m  Temp.: 98.29F  Pulse: 112 (Regular)  BP: 118/80 (Sitting, Left Arm, Standard)       Physical Exam Jeanne Key K. Jonalyn Sedlak MD; 10/18/2019 5:08 PM) The physical exam findings are as follows: Note:WDWN in NAD Eyes: Pupils equal, round; sclera anicteric HENT: Oral mucosa moist; good dentition Neck: No masses palpated, no thyromegaly Lungs: CTA bilaterally; normal respiratory effort CV: Regular rate and rhythm; no murmurs; extremities well-perfused with no edema Abd: +bowel sounds, soft, non-tender, no palpable organomegaly; no palpable hernias Skin: Warm, dry; no sign of jaundice Psychiatric - alert and oriented x 4; calm mood and  affect    Assessment & Plan Jeanne Key K. Tihanna Goodson MD; 10/18/2019 5:10 PM) EPIGASTRIC PAIN (R10.13) POSTPRANDIAL DIARRHEA (K52.9) Current Plans Schedule for Surgery - Laparoscopic cholecystectomy with intraoperative cholangiogram. The surgical procedure has been discussed with the patient. Potential risks, benefits, alternative treatments, and expected outcomes have been explained. All of the patient's questions at this time have been answered. The likelihood of reaching the patient's treatment goal is good. The patient understand the proposed surgical procedure and wishes to proceed. Note:I spent some additional time with the patient discussing her upcoming procedure. Obviously, it is unusual to pursue cholecystectomy with the lack of objective findings on imaging. However, I feel that her symptoms are classic for gallbladder disease. Despite a low normal gallbladder ejection fraction, the patient did have some symptoms after ingesting her Ensure. She understands that cholecystectomy may not alleviate all of her symptoms but I do feel that she will likely benefit from cholecystectomy. The patient is eager to pursue surgery.  I spent a total of 30 minutes on this visit with review of all of her multiple previous procedures, imaging studies, and extensive lab work as well as interviewing and counseling the patient    Jeanne Frederick. Georgette Dover, MD, [facility]  General/ Trauma Surgery   10/18/2019 5:14 PM

## 2019-10-18 NOTE — H&P (Signed)
History of Present Illness Jeanne Frederick. Jeanne Fei MD; 10/18/2019 5:08 PM) The patient is a 24 year old female who presents with abdominal pain. Referred by Dr. 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 Her symptoms tend to be postprandial. Her pain is associated with a burning sensation heading up into her lower chest as well as nausea, bloating, and copious diarrhea. She denies any vomiting. Her symptoms tend to get worse with greasy foods. The patient has had a very extensive GI workup over the last several months. Gluten sensitivity was ruled out. Stool studies, CT scan, ultrasound, EGD, colonoscopy were all unremarkable. She had a HIDA scan performed on 10/03/19 that showed a low normal ejection fraction of 46% with normal greater than 33%. She did have some mild symptoms proximally 30 minutes after her study after ingestion of the Ensure. The patient is frustrated by the lack of an answer for her continued symptoms.  Upon further questioning of the patient, she has not really changed her diet. She continues to eat a fair amount of fast food chicken as well as [place] burgers. She has not really lost any weight over the last several months despite his continued symptoms. Her father had his gallbladder removed and reportedly did not have gallstones. Due to the lack of answers, the patient has been referred to me to discuss elective cholecystectomy based on her symptoms alone. She currently is not on any GI medications.  In June 2020, liver function tests, lipase, amylase were all normal.  CLINICAL DATA: Mid epigastric pain. Anemia.  EXAM: CT ABDOMEN AND PELVIS WITH CONTRAST  TECHNIQUE: Multidetector CT imaging of the abdomen and pelvis was performed using the standard protocol following bolus  administration of intravenous contrast.  CONTRAST: 121mL OMNIPAQUE IOHEXOL 300 MG/ML SOLN  COMPARISON: 02/21/2018  FINDINGS: Lower chest: Unremarkable.  Hepatobiliary: No suspicious focal abnormality within the liver parenchyma. There is no evidence for gallstones, gallbladder wall thickening, or pericholecystic fluid. No intrahepatic or extrahepatic biliary dilation.  Pancreas: No focal mass lesion. No dilatation of the main duct. No intraparenchymal cyst. No peripancreatic edema.  Spleen: No splenomegaly. No focal mass lesion.  Adrenals/Urinary Tract: No adrenal nodule or mass. Right kidney unremarkable. Punctate nonobstructing stone identified lower pole left kidney (image 45/coronal series 5). No evidence for hydroureter. The urinary bladder appears normal for the degree of distention.  Stomach/Bowel: Stomach is unremarkable. No gastric wall thickening. No evidence of outlet obstruction. Duodenum is normally positioned as is the ligament of Treitz. No small bowel wall thickening. No small bowel dilatation. The terminal ileum is normal. The appendix is normal. Colon is decompressed.  Vascular/Lymphatic: No abdominal aortic aneurysm. No abdominal aortic atherosclerotic calcification. There is no gastrohepatic or hepatoduodenal ligament lymphadenopathy. No intraperitoneal or retroperitoneal lymphadenopathy. No pelvic sidewall lymphadenopathy.  Reproductive: The uterus is unremarkable. There is no adnexal mass.  Other: No intraperitoneal free fluid.  Musculoskeletal: No worrisome lytic or sclerotic osseous abnormality.  IMPRESSION: 1. No acute findings in the abdomen or pelvis. No findings to explain the patient's history of pain. 2. Punctate nonobstructing stone lower pole left kidney.   Electronically Signed By: Jeanne Frederick M.D. On: 04/13/2019 09:29  CLINICAL DATA: Abdominal pain  EXAM: ABDOMEN ULTRASOUND COMPLETE  COMPARISON: 02/21/2018  CT  FINDINGS: Gallbladder: No gallstones or wall thickening visualized. No sonographic Murphy sign noted by  sonographer.  Common bile duct: Diameter: Normal caliber, 1 mm  Liver: No focal lesion identified. Within normal limits in parenchymal echogenicity. Portal vein is patent on color Doppler imaging with normal direction of blood flow towards the liver.  IVC: No abnormality visualized.  Pancreas: Visualized portion unremarkable.  Spleen: Size and appearance within normal limits.  Right Kidney: Length: 11.5 cm. Echogenicity within normal limits. No mass or hydronephrosis visualized.  Left Kidney: Length: 11.5 cm. Echogenicity within normal limits. No mass or hydronephrosis visualized.  Abdominal aorta: No aneurysm visualized.  Other findings: None.  IMPRESSION: Normal abdominal ultrasound.   Electronically Signed By: Jeanne Frederick M.D. On: 05/26/2019 13:20  CLINICAL DATA: Epigastric pain, loose stools, heartburn  EXAM: NUCLEAR MEDICINE HEPATOBILIARY IMAGING WITH GALLBLADDER EF  TECHNIQUE: Sequential images of the abdomen were obtained out to 60 minutes following intravenous administration of radiopharmaceutical. After oral ingestion of Ensure, gallbladder ejection fraction was determined. At 60 min, normal ejection fraction is greater than 33%.  RADIOPHARMACEUTICALS: 5.3 mCi Tc-47m Choletec IV  COMPARISON: None.  FINDINGS: Prompt uptake and biliary excretion of activity by the liver is seen. Gallbladder activity is visualized, consistent with patency of cystic duct. Biliary activity passes into small bowel, consistent with patent common bile duct.  Calculated gallbladder ejection fraction is 46%. (Normal gallbladder ejection fraction with Ensure is greater than 33%.)  IMPRESSION: 1. Normal hepatobiliary scan. No biliary obstruction. 2. Normal gallbladder ejection fraction.   Electronically Signed By: Jeanne Frederick On: 10/03/2019  11:38   Problem List/Past Medical [name], MD; 10/18/2019 5:08 PM) EPIGASTRIC PAIN (R10.13)  POSTPRANDIAL DIARRHEA (K52.9)   Past Surgical History [name], [facility]; 10/18/2019 1:50 PM) Colon Polyp Removal - Colonoscopy   Diagnostic Studies History [name], CMA; 10/18/2019 1:50 PM) Colonoscopy  within last year  Allergies [name], CMA; 10/18/2019 1:53 PM) Beef (Diagnostic) *DIAGNOSTIC PRODUCTS*  Allergic to Red Meat Allergies Reconciled   Medication History [name], CMA; 10/18/2019 1:51 PM) EPINEPHrine (0.3MG /0.3ML Soln Auto-inj, Injection) Active. Junel 1.5/30 (1.5-30MG -MCG Tablet, Oral) Active.  Social History [name], [place]; 10/18/2019 1:50 PM) Caffeine use  Carbonated beverages. Tobacco use  Never smoker.  Family History [name], [place]; 10/18/2019 1:50 PM) Anesthetic complications  Father. Colon Polyps  Father. Thyroid problems  Mother.  Pregnancy / Birth History [name], [place]; 10/18/2019 1:50 PM) Age at menarche  27 years. Contraceptive History  Oral contraceptives. Gravida  0 Para  0  Other Problems Jeanne Frederick. Georgette Dover, MD; 10/18/2019 5:08 PM) Kidney Joaquim Lai     Review of Systems [name] CMA; 10/18/2019 1:50 PM) General Present- Night Sweats. Not Present- Appetite Loss, Chills, Fatigue, Fever, Weight Gain and Weight Loss. Skin Present- Dryness. Not Present- Change in Wart/Mole, Hives, Jaundice, New Lesions, Non-Healing Wounds, Rash and Ulcer. HEENT Present- Wears glasses/contact lenses. Not Present- Earache, Hearing Loss, Hoarseness, Nose Bleed, Oral Ulcers, Ringing in the Ears, Seasonal Allergies, Sinus Pain, Sore Throat, Visual Disturbances and Yellow Eyes. Respiratory Not Present- Bloody sputum, Chronic Cough, Difficulty Breathing, Snoring and Wheezing. Breast Not Present- Breast Mass, Breast Pain, Nipple Discharge and Skin Changes. Cardiovascular Not Present- Chest Pain, Difficulty  Breathing Lying Down, Leg Cramps, Palpitations, Rapid Heart Rate, Shortness of Breath and Swelling of Extremities. Gastrointestinal Present- Abdominal Pain, Chronic diarrhea, Constipation and Gets full quickly at meals. Not Present- Bloating, Bloody Stool, Change in Bowel Habits, Difficulty Swallowing, Excessive gas, Hemorrhoids, Indigestion, Nausea, Rectal Pain and Vomiting. Female Genitourinary Not Present- Frequency, Nocturia, Painful Urination, Pelvic Pain and Urgency. Musculoskeletal Not Present- Back  Pain, Joint Pain, Joint Stiffness, Muscle Pain, Muscle Weakness and Swelling of Extremities. Neurological Not Present- Decreased Memory, Fainting, Headaches, Numbness, Seizures, Tingling, Tremor, Trouble walking and Weakness. Psychiatric Not Present- Anxiety, Bipolar, Change in Sleep Pattern, Depression, Fearful and Frequent crying. Endocrine Present- Cold Intolerance and Hot flashes. Not Present- Excessive Hunger, Hair Changes, Heat Intolerance and New Diabetes. Hematology Not Present- Blood Thinners, Easy Bruising, Excessive bleeding, Gland problems, HIV and Persistent Infections.  Vitals [name] CMA; 10/18/2019 1:50 PM) 10/18/2019 1:50 PM Weight: 167.8 lb Height: 63in Body Surface Area: 1.79 m Body Mass Index: 29.72 kg/m  Temp.: 98.35F  Pulse: 112 (Regular)  BP: 118/80 (Sitting, Left Arm, Standard)       Physical Exam Rodman Key K. Cesiah Westley MD; 10/18/2019 5:08 PM) The physical exam findings are as follows: Note:WDWN in NAD Eyes: Pupils equal, round; sclera anicteric HENT: Oral mucosa moist; good dentition Neck: No masses palpated, no thyromegaly Lungs: CTA bilaterally; normal respiratory effort CV: Regular rate and rhythm; no murmurs; extremities well-perfused with no edema Abd: +bowel sounds, soft, non-tender, no palpable organomegaly; no palpable hernias Skin: Warm, dry; no sign of jaundice Psychiatric - alert and oriented x 4; calm mood and  affect    Assessment & Plan Rodman Key K. Shaye Lagace MD; 10/18/2019 5:10 PM) EPIGASTRIC PAIN (R10.13) POSTPRANDIAL DIARRHEA (K52.9) Current Plans Schedule for Surgery - Laparoscopic cholecystectomy with intraoperative cholangiogram. The surgical procedure has been discussed with the patient. Potential risks, benefits, alternative treatments, and expected outcomes have been explained. All of the patient's questions at this time have been answered. The likelihood of reaching the patient's treatment goal is good. The patient understand the proposed surgical procedure and wishes to proceed. Note:I spent some additional time with the patient discussing her upcoming procedure. Obviously, it is unusual to pursue cholecystectomy with the lack of objective findings on imaging. However, I feel that her symptoms are classic for gallbladder disease. Despite a low normal gallbladder ejection fraction, the patient did have some symptoms after ingesting her Ensure. She understands that cholecystectomy may not alleviate all of her symptoms but I do feel that she will likely benefit from cholecystectomy. The patient is eager to pursue surgery.  I spent a total of 30 minutes on this visit with review of all of her multiple previous procedures, imaging studies, and extensive lab work as well as interviewing and counseling the patient    Jeanne Frederick. Georgette Dover, MD, [facility]  General/ Trauma Surgery   10/18/2019 5:14 PM

## 2019-10-27 LAB — OVA AND PARASITE EXAMINATION
CONCENTRATE RESULT:: NONE SEEN
MICRO NUMBER:: 611945
SPECIMEN QUALITY:: ADEQUATE
TRICHROME RESULT:: NONE SEEN

## 2019-10-27 LAB — FECAL FAT, QUANTITATIVE: Specimen Total Weight: 29 g

## 2019-10-27 LAB — HELICOBACTER PYLORI  SPECIAL ANTIGEN
MICRO NUMBER:: 611998
SPECIMEN QUALITY: ADEQUATE

## 2019-10-27 LAB — TEST AUTHORIZATION

## 2019-10-27 LAB — GASTROINTESTINAL PATHOGEN PANEL PCR
C. difficile Tox A/B, PCR: UNDETERMINED — AB
Campylobacter, PCR: UNDETERMINED — AB
Cryptosporidium, PCR: UNDETERMINED — AB
E coli (ETEC) LT/ST PCR: UNDETERMINED — AB
E coli (STEC) stx1/stx2, PCR: UNDETERMINED — AB
E coli 0157, PCR: UNDETERMINED — AB
Giardia lamblia, PCR: UNDETERMINED — AB
Norovirus, PCR: UNDETERMINED — AB
Rotavirus A, PCR: UNDETERMINED — AB
Salmonella, PCR: UNDETERMINED — AB
Shigella, PCR: UNDETERMINED — AB

## 2019-10-27 LAB — FECAL LEUKOCYTES

## 2019-10-27 LAB — PANCREATIC ELASTASE, FECAL: Pancreatic Elastase-1, Stool: >500 mcg/g

## 2019-10-27 LAB — CLOSTRIDIUM DIFFICILE TOXIN B, QUALITATIVE, REAL-TIME PCR: Toxigenic C. Difficile by PCR: NOT DETECTED

## 2019-10-27 LAB — TIQ-MISC

## 2019-10-28 ENCOUNTER — Encounter (HOSPITAL_BASED_OUTPATIENT_CLINIC_OR_DEPARTMENT_OTHER): Payer: Self-pay | Admitting: Surgery

## 2019-10-28 ENCOUNTER — Other Ambulatory Visit: Payer: Self-pay

## 2019-10-31 ENCOUNTER — Other Ambulatory Visit (HOSPITAL_COMMUNITY)
Admission: RE | Admit: 2019-10-31 | Discharge: 2019-10-31 | Disposition: A | Discharge status: Home / Self Care | Payer: No Typology Code available for payment source | Source: Ambulatory Visit | Attending: Surgery | Admitting: Surgery

## 2019-10-31 DIAGNOSIS — Z20822 Contact with and (suspected) exposure to covid-19: Secondary | ICD-10-CM | POA: Insufficient documentation

## 2019-10-31 DIAGNOSIS — Z01812 Encounter for preprocedural laboratory examination: Secondary | ICD-10-CM | POA: Insufficient documentation

## 2019-10-31 LAB — SARS CORONAVIRUS 2 (TAT 6-24 HRS): SARS Coronavirus 2: NEGATIVE

## 2019-10-31 NOTE — Progress Notes (Signed)

## 2019-11-02 ENCOUNTER — Encounter (HOSPITAL_BASED_OUTPATIENT_CLINIC_OR_DEPARTMENT_OTHER): Payer: Self-pay | Admitting: Surgery

## 2019-11-03 ENCOUNTER — Encounter (HOSPITAL_BASED_OUTPATIENT_CLINIC_OR_DEPARTMENT_OTHER)
Admission: RE | Disposition: A | Discharge status: Home / Self Care | Payer: Self-pay | Source: Home / Self Care | Attending: Surgery

## 2019-11-03 ENCOUNTER — Encounter (HOSPITAL_BASED_OUTPATIENT_CLINIC_OR_DEPARTMENT_OTHER): Payer: Self-pay | Admitting: Surgery

## 2019-11-03 ENCOUNTER — Ambulatory Visit (HOSPITAL_BASED_OUTPATIENT_CLINIC_OR_DEPARTMENT_OTHER)
Admission: RE | Admit: 2019-11-03 | Discharge: 2019-11-03 | Disposition: A | Discharge status: Home / Self Care | Payer: No Typology Code available for payment source | Attending: Surgery | Admitting: Surgery

## 2019-11-03 ENCOUNTER — Ambulatory Visit (HOSPITAL_BASED_OUTPATIENT_CLINIC_OR_DEPARTMENT_OTHER): Payer: No Typology Code available for payment source | Admitting: Anesthesiology

## 2019-11-03 ENCOUNTER — Ambulatory Visit (HOSPITAL_COMMUNITY): Payer: No Typology Code available for payment source

## 2019-11-03 ENCOUNTER — Other Ambulatory Visit: Payer: Self-pay

## 2019-11-03 DIAGNOSIS — Z91018 Allergy to other foods: Secondary | ICD-10-CM | POA: Diagnosis not present

## 2019-11-03 DIAGNOSIS — K811 Chronic cholecystitis: Secondary | ICD-10-CM | POA: Diagnosis not present

## 2019-11-03 DIAGNOSIS — Z419 Encounter for procedure for purposes other than remedying health state, unspecified: Secondary | ICD-10-CM

## 2019-11-03 HISTORY — DX: Calculus of kidney: N20.0

## 2019-11-03 HISTORY — DX: Gastro-esophageal reflux disease without esophagitis: K21.9

## 2019-11-03 HISTORY — DX: Family history of other specified conditions: Z84.89

## 2019-11-03 HISTORY — PX: CHOLECYSTECTOMY: SHX55

## 2019-11-03 HISTORY — DX: Renal tubulo-interstitial disease, unspecified: N15.9

## 2019-11-03 LAB — POCT PREGNANCY, URINE: Preg Test, Ur: NEGATIVE

## 2019-11-03 SURGERY — LAPAROSCOPIC CHOLECYSTECTOMY WITH INTRAOPERATIVE CHOLANGIOGRAM
Anesthesia: General | Site: Abdomen

## 2019-11-03 MED ORDER — PROPOFOL 10 MG/ML IV BOLUS
INTRAVENOUS | Status: AC
  Filled 2019-11-03: qty 20

## 2019-11-03 MED ORDER — FENTANYL CITRATE (PF) 100 MCG/2ML IJ SOLN
INTRAMUSCULAR | Status: AC
  Filled 2019-11-03: qty 2

## 2019-11-03 MED ORDER — GLYCOPYRROLATE PF 0.2 MG/ML IJ SOSY
PREFILLED_SYRINGE | INTRAMUSCULAR | Status: DC | PRN
  Administered 2019-11-03: .2 mg via INTRAVENOUS

## 2019-11-03 MED ORDER — CHLORHEXIDINE GLUCONATE CLOTH 2 % EX PADS: 6.0000 | MEDICATED_PAD | Freq: Once | CUTANEOUS | Status: DC

## 2019-11-03 MED ORDER — FENTANYL CITRATE (PF) 100 MCG/2ML IJ SOLN
INTRAMUSCULAR | Status: DC | PRN
  Administered 2019-11-03: 100 ug via INTRAVENOUS

## 2019-11-03 MED ORDER — ONDANSETRON HCL 4 MG/2ML IJ SOLN
INTRAMUSCULAR | Status: DC | PRN
  Administered 2019-11-03: 4 mg via INTRAVENOUS

## 2019-11-03 MED ORDER — ONDANSETRON HCL 4 MG/2ML IJ SOLN: 4.0000 mg | Freq: Once | INTRAMUSCULAR | Status: DC | PRN

## 2019-11-03 MED ORDER — LIDOCAINE 2% (20 MG/ML) 5 ML SYRINGE
INTRAMUSCULAR | Status: DC | PRN
  Administered 2019-11-03: 80 mg via INTRAVENOUS

## 2019-11-03 MED ORDER — SODIUM CHLORIDE 0.9 % IV SOLN
INTRAVENOUS | Status: DC | PRN
  Administered 2019-11-03: 15 mL

## 2019-11-03 MED ORDER — OXYCODONE HCL 5 MG PO TABS
ORAL_TABLET | ORAL | Status: AC
  Filled 2019-11-03: qty 1

## 2019-11-03 MED ORDER — SUGAMMADEX SODIUM 200 MG/2ML IV SOLN
INTRAVENOUS | Status: DC | PRN
  Administered 2019-11-03: 200 mg via INTRAVENOUS

## 2019-11-03 MED ORDER — FENTANYL CITRATE (PF) 100 MCG/2ML IJ SOLN
25.0000 ug | INTRAMUSCULAR | Status: DC | PRN
  Administered 2019-11-03: 16:00:00 50 ug via INTRAVENOUS

## 2019-11-03 MED ORDER — LACTATED RINGERS IV SOLN: INTRAVENOUS | Status: DC

## 2019-11-03 MED ORDER — PROMETHAZINE HCL 25 MG/ML IJ SOLN
6.2500 mg | Freq: Four times a day (QID) | INTRAMUSCULAR | Status: DC | PRN
  Administered 2019-11-03: 17:00:00 6.25 mg via INTRAVENOUS

## 2019-11-03 MED ORDER — MIDAZOLAM HCL 2 MG/2ML IJ SOLN
INTRAMUSCULAR | Status: AC
  Filled 2019-11-03: qty 2

## 2019-11-03 MED ORDER — ACETAMINOPHEN 500 MG PO TABS
ORAL_TABLET | ORAL | Status: AC
  Filled 2019-11-03: qty 2

## 2019-11-03 MED ORDER — OXYCODONE HCL 5 MG PO TABS: 5.0000 mg | ORAL_TABLET | Freq: Four times a day (QID) | ORAL | 0 refills | Status: DC | PRN

## 2019-11-03 MED ORDER — KETOROLAC TROMETHAMINE 30 MG/ML IJ SOLN
INTRAMUSCULAR | Status: AC
  Filled 2019-11-03: qty 1

## 2019-11-03 MED ORDER — CEFAZOLIN SODIUM-DEXTROSE 2-4 GM/100ML-% IV SOLN
2.0000 g | INTRAVENOUS | Status: AC
  Administered 2019-11-03: 2 g via INTRAVENOUS

## 2019-11-03 MED ORDER — PHENYLEPHRINE 40 MCG/ML (10ML) SYRINGE FOR IV PUSH (FOR BLOOD PRESSURE SUPPORT)
PREFILLED_SYRINGE | INTRAVENOUS | Status: AC
  Filled 2019-11-03: qty 10

## 2019-11-03 MED ORDER — PROPOFOL 10 MG/ML IV BOLUS
INTRAVENOUS | Status: DC | PRN
  Administered 2019-11-03: 200 mg via INTRAVENOUS

## 2019-11-03 MED ORDER — ROCURONIUM BROMIDE 10 MG/ML (PF) SYRINGE
PREFILLED_SYRINGE | INTRAVENOUS | Status: AC
  Filled 2019-11-03: qty 10

## 2019-11-03 MED ORDER — OXYCODONE HCL 5 MG PO TABS
5.0000 mg | ORAL_TABLET | Freq: Once | ORAL | Status: AC | PRN
  Administered 2019-11-03: 17:00:00 5 mg via ORAL

## 2019-11-03 MED ORDER — SODIUM CHLORIDE 0.9 % IR SOLN
Status: DC | PRN
  Administered 2019-11-03: 400 mL

## 2019-11-03 MED ORDER — DEXAMETHASONE SODIUM PHOSPHATE 10 MG/ML IJ SOLN
INTRAMUSCULAR | Status: DC | PRN
  Administered 2019-11-03: 5 mg via INTRAVENOUS

## 2019-11-03 MED ORDER — GABAPENTIN 300 MG PO CAPS
300.0000 mg | ORAL_CAPSULE | ORAL | Status: AC
  Administered 2019-11-03: 11:00:00 300 mg via ORAL

## 2019-11-03 MED ORDER — GABAPENTIN 300 MG PO CAPS
ORAL_CAPSULE | ORAL | Status: AC
  Filled 2019-11-03: qty 1

## 2019-11-03 MED ORDER — GLYCOPYRROLATE PF 0.2 MG/ML IJ SOSY
PREFILLED_SYRINGE | INTRAMUSCULAR | Status: AC
  Filled 2019-11-03: qty 2

## 2019-11-03 MED ORDER — ACETAMINOPHEN 500 MG PO TABS
1000.0000 mg | ORAL_TABLET | ORAL | Status: AC
  Administered 2019-11-03: 11:00:00 1000 mg via ORAL

## 2019-11-03 MED ORDER — MIDAZOLAM HCL 2 MG/2ML IJ SOLN
INTRAMUSCULAR | Status: DC | PRN
  Administered 2019-11-03: 2 mg via INTRAVENOUS

## 2019-11-03 MED ORDER — CEFAZOLIN SODIUM-DEXTROSE 2-4 GM/100ML-% IV SOLN
INTRAVENOUS | Status: AC
  Filled 2019-11-03: qty 100

## 2019-11-03 MED ORDER — BUPIVACAINE HCL (PF) 0.25 % IJ SOLN
INTRAMUSCULAR | Status: DC | PRN
  Administered 2019-11-03: 23 mL

## 2019-11-03 MED ORDER — VANCOMYCIN HCL 1000 MG IV SOLR
INTRAVENOUS | Status: AC
  Filled 2019-11-03: qty 1000

## 2019-11-03 MED ORDER — PHENYLEPHRINE 40 MCG/ML (10ML) SYRINGE FOR IV PUSH (FOR BLOOD PRESSURE SUPPORT)
PREFILLED_SYRINGE | INTRAVENOUS | Status: DC | PRN
  Administered 2019-11-03 (×2): 80 ug via INTRAVENOUS

## 2019-11-03 MED ORDER — KETOROLAC TROMETHAMINE 30 MG/ML IJ SOLN
INTRAMUSCULAR | Status: DC | PRN
  Administered 2019-11-03: 30 mg via INTRAVENOUS

## 2019-11-03 MED ORDER — PROMETHAZINE HCL 25 MG/ML IJ SOLN
INTRAMUSCULAR | Status: AC
  Filled 2019-11-03: qty 1

## 2019-11-03 MED ORDER — ROCURONIUM BROMIDE 50 MG/5ML IV SOSY
PREFILLED_SYRINGE | INTRAVENOUS | Status: DC | PRN
  Administered 2019-11-03: 40 mg via INTRAVENOUS

## 2019-11-03 MED ORDER — OXYCODONE HCL 5 MG/5ML PO SOLN: 5.0000 mg | Freq: Once | ORAL | Status: AC | PRN

## 2019-11-03 SURGICAL SUPPLY — 45 items
APPLIER CLIP ROT 10 11.4 M/L (STAPLE) ×2
BENZOIN TINCTURE PRP APPL 2/3 (GAUZE/BANDAGES/DRESSINGS) ×2 IMPLANT
BLADE CLIPPER SURG (BLADE) IMPLANT
CHLORAPREP W/TINT 26 (MISCELLANEOUS) ×4 IMPLANT
CLIP APPLIE ROT 10 11.4 M/L (STAPLE) ×1 IMPLANT
COVER MAYO STAND STRL (DRAPES) ×2 IMPLANT
COVER SURGICAL LIGHT HANDLE (MISCELLANEOUS) IMPLANT
COVER WAND RF STERILE (DRAPES) IMPLANT
DECANTER SPIKE VIAL GLASS SM (MISCELLANEOUS) IMPLANT
DRAPE C-ARM 42X72 X-RAY (DRAPES) ×2 IMPLANT
DRAPE LAPAROSCOPIC ABDOMINAL (DRAPES) ×2 IMPLANT
DRSG TEGADERM 2-3/8X2-3/4 SM (GAUZE/BANDAGES/DRESSINGS) ×6 IMPLANT
DRSG TEGADERM 4X4.75 (GAUZE/BANDAGES/DRESSINGS) ×2 IMPLANT
ELECT REM PT RETURN 9FT ADLT (ELECTROSURGICAL) ×2
ELECTRODE REM PT RTRN 9FT ADLT (ELECTROSURGICAL) ×1 IMPLANT
GLOVE BIO SURGEON STRL SZ7 (GLOVE) ×4 IMPLANT
GLOVE BIOGEL PI IND STRL 7.0 (GLOVE) ×2 IMPLANT
GLOVE BIOGEL PI IND STRL 7.5 (GLOVE) ×1 IMPLANT
GLOVE BIOGEL PI INDICATOR 7.0 (GLOVE) ×2
GLOVE BIOGEL PI INDICATOR 7.5 (GLOVE) ×1
GLOVE SURG SYN 7.5  E (GLOVE) ×1
GLOVE SURG SYN 7.5 E (GLOVE) ×1 IMPLANT
GOWN STRL REUS W/ TWL XL LVL3 (GOWN DISPOSABLE) ×2 IMPLANT
GOWN STRL REUS W/TWL XL LVL3 (GOWN DISPOSABLE) ×2
HEMOSTAT SNOW SURGICEL 2X4 (HEMOSTASIS) IMPLANT
NS IRRIG 1000ML POUR BTL (IV SOLUTION) IMPLANT
PACK BASIN DAY SURGERY FS (CUSTOM PROCEDURE TRAY) ×2 IMPLANT
PAD ARMBOARD 7.5X6 YLW CONV (MISCELLANEOUS) IMPLANT
POUCH SPECIMEN RETRIEVAL 10MM (ENDOMECHANICALS) ×2 IMPLANT
SCISSORS LAP 5X35 DISP (ENDOMECHANICALS) ×2 IMPLANT
SET CHOLANGIOGRAPH 5 50 .035 (SET/KITS/TRAYS/PACK) ×2 IMPLANT
SET IRRIG TUBING LAPAROSCOPIC (IRRIGATION / IRRIGATOR) ×2 IMPLANT
SET TUBE SMOKE EVAC HIGH FLOW (TUBING) ×2 IMPLANT
SLEEVE ENDOPATH XCEL 5M (ENDOMECHANICALS) ×2 IMPLANT
SLEEVE SCD COMPRESS KNEE MED (MISCELLANEOUS) ×2 IMPLANT
SPECIMEN JAR SMALL (MISCELLANEOUS) IMPLANT
SPONGE GAUZE 2X2 8PLY STRL LF (GAUZE/BANDAGES/DRESSINGS) ×2 IMPLANT
STRIP CLOSURE SKIN 1/2X4 (GAUZE/BANDAGES/DRESSINGS) ×2 IMPLANT
SUT MNCRL AB 4-0 PS2 18 (SUTURE) ×2 IMPLANT
SUT VICRYL 0 UR6 27IN ABS (SUTURE) ×2 IMPLANT
TOWEL GREEN STERILE FF (TOWEL DISPOSABLE) ×2 IMPLANT
TRAY LAPAROSCOPIC (CUSTOM PROCEDURE TRAY) ×2 IMPLANT
TROCAR XCEL BLUNT TIP 100MML (ENDOMECHANICALS) ×2 IMPLANT
TROCAR XCEL NON-BLD 11X100MML (ENDOMECHANICALS) ×2 IMPLANT
TROCAR XCEL NON-BLD 5MMX100MML (ENDOMECHANICALS) ×2 IMPLANT

## 2019-11-03 NOTE — Anesthesia Procedure Notes (Signed)
Procedure Name: Intubation Date/Time: 11/03/2019 2:03 PM Performed by: Genelle Bal, CRNA Pre-anesthesia Checklist: Patient identified, Emergency Drugs available, Suction available and Patient being monitored Patient Re-evaluated:Patient Re-evaluated prior to induction Oxygen Delivery Method: Circle system utilized Preoxygenation: Pre-oxygenation with 100% oxygen Induction Type: IV induction Ventilation: Mask ventilation without difficulty Laryngoscope Size: Miller and 2 Grade View: Grade I Tube type: Oral Tube size: 7.0 mm Number of attempts: 1 Airway Equipment and Method: Stylet and Oral airway Placement Confirmation: ETT inserted through vocal cords under direct vision,  positive ETCO2 and breath sounds checked- equal and bilateral Secured at: 21 cm Tube secured with: Tape Dental Injury: Teeth and Oropharynx as per pre-operative assessment

## 2019-11-03 NOTE — Anesthesia Preprocedure Evaluation (Signed)
Anesthesia Evaluation  Patient identified by MRN, date of birth, ID band Patient awake    Reviewed: Allergy & Precautions, NPO status , Patient's Chart, lab work & pertinent test results  History of Anesthesia Complications Negative for: history of anesthetic complications  Airway Mallampati: II  TM Distance: >3 FB Neck ROM: Full    Dental  (+) Teeth Intact Permanent retainer bottom:   Pulmonary neg pulmonary ROS,    Pulmonary exam normal        Cardiovascular negative cardio ROS Normal cardiovascular exam     Neuro/Psych negative neurological ROS  negative psych ROS   GI/Hepatic Neg liver ROS, GERD  ,  Endo/Other  negative endocrine ROS  Renal/GU negative Renal ROS  negative genitourinary   Musculoskeletal negative musculoskeletal ROS (+)   Abdominal   Peds  Hematology negative hematology ROS (+)   Anesthesia Other Findings   Reproductive/Obstetrics                             Anesthesia Physical Anesthesia Plan  ASA: II  Anesthesia Plan: General   Post-op Pain Management:    Induction: Intravenous  PONV Risk Score and Plan: 4 or greater and Ondansetron, Dexamethasone, Treatment may vary due to age or medical condition and Midazolam  Airway Management Planned: Oral ETT  Additional Equipment: None  Intra-op Plan:   Post-operative Plan: Extubation in OR  Informed Consent: I have reviewed the patients History and Physical, chart, labs and discussed the procedure including the risks, benefits and alternatives for the proposed anesthesia with the patient or authorized representative who has indicated his/her understanding and acceptance.     Dental advisory given  Plan Discussed with:   Anesthesia Plan Comments:        Anesthesia Quick Evaluation

## 2019-11-03 NOTE — Discharge Instructions (Signed)
[facility] LAPAROSCOPIC SURGERY: POST OP INSTRUCTIONS Always review your discharge instruction sheet given to you by the facility where your surgery was performed. IF YOU HAVE DISABILITY OR FAMILY LEAVE FORMS, YOU MUST BRING THEM TO THE OFFICE FOR PROCESSING.   DO NOT GIVE THEM TO YOUR DOCTOR.  1. A prescription for pain medication will be given to you upon discharge.  Take your pain medication as prescribed, if needed.  If narcotic pain medicine is not needed, then you may take acetaminophen (Tylenol) or ibuprofen (Advil) as needed. 2. Take your usually prescribed medications unless otherwise directed. 3. If you need a refill on your pain medication, please contact your pharmacy.  They will contact our office to request authorization. Prescriptions will not be filled after 5pm or on week-ends. 4. You should follow a light diet the first few days after arrival home, such as soup and crackers, etc.  Be sure to include lots of fluids daily. 5. Most patients will experience some swelling and bruising in the area of the incisions.  Ice packs will help.  Swelling and bruising can take several days to resolve.  6. It is common to experience some constipation if taking pain medication after surgery.  Increasing fluid intake and taking a stool softener (such as Colace) will usually help or prevent this problem from occurring.  A mild laxative (Milk of Magnesia or Miralax) should be taken according to package instructions if there are no bowel movements after 48 hours. 7. Unless discharge instructions indicate otherwise, you may remove your bandages 48 hours after surgery, and you may shower at that time.  You will have steri-strips (small skin tapes) in place directly over the incision.  These strips should be left on the skin for 7-10 days.  If your surgeon used skin glue on the incision, you may shower in 24 hours.  The glue will flake off over the next 2-3 weeks.  Any sutures or staples  will be removed at the office during your follow-up visit. 8. ACTIVITIES:  You may resume regular (light) daily activities beginning the next day--such as daily self-care, walking, climbing stairs--gradually increasing activities as tolerated.  You may have sexual intercourse when it is comfortable.  Refrain from any heavy lifting or straining until approved by your doctor. a. You may drive when you are no longer taking prescription pain medication, you can comfortably wear a seatbelt, and you can safely maneuver your car and apply brakes. b. RETURN TO WORK:   2-3 weeks 9. You should see your doctor in the office for a follow-up appointment approximately 2-3 weeks after your surgery.  Make sure that you call for this appointment within a day or two after you arrive home to insure a convenient appointment time. 10. OTHER INSTRUCTIONS: ________________________________________________________________________ WHEN TO CALL YOUR DOCTOR: 1. Fever over 101.0 2. Inability to urinate 3. Continued bleeding from incision. 4. Increased pain, redness, or drainage from the incision. 5. Increasing abdominal pain  OXYCODONE GIVEN AT 4:49. NO PAIN MED UNTIL 10:50    The clinic staff is available to answer your questions during regular business hours.  Please dont hesitate to call and ask to speak to one of the nurses for clinical concerns.  If you have a medical emergency, go to the nearest emergency room or call 911.  A surgeon from [facility] is always on call at the hospital. [address] [place], [place], [place]  [address] ? [address] [address], [place], [place]  [id number] [phone] ? [phone] ? FAX [phone] OWeb site: [email or web address]     Post Anesthesia Home Care Instructions  Activity: Get plenty of rest for the remainder of the day. A responsible individual must stay with you for 24 hours following the procedure.  For the next 24 hours, DO NOT: -Drive  a car -Paediatric nurse -Drink alcoholic beverages -Take any medication unless instructed by your physician -Make any legal decisions or sign important papers.  Meals: Start with liquid foods such as gelatin or soup. Progress to regular foods as tolerated. Avoid greasy, spicy, heavy foods. If nausea and/or vomiting occur, drink only clear liquids until the nausea and/or vomiting subsides. Call your physician if vomiting continues.  Special Instructions/Symptoms: Your throat may feel dry or sore from the anesthesia or the breathing tube placed in your throat during surgery. If this causes discomfort, gargle with warm salt water. The discomfort should disappear within 24 hours.  If you had a scopolamine patch placed behind your ear for the management of post- operative nausea and/or vomiting:  1. The medication in the patch is effective for 72 hours, after which it should be removed.  Wrap patch in a tissue and discard in the trash. Wash hands thoroughly with soap and water. 2. You may remove the patch earlier than 72 hours if you experience unpleasant side effects which may include dry mouth, dizziness or visual disturbances. 3. Avoid touching the patch. Wash your hands with soap and water after contact with the patch.

## 2019-11-03 NOTE — Anesthesia Postprocedure Evaluation (Signed)
Anesthesia Post Note  Patient: Jeanne Frederick  Procedure(s) Performed: LAPAROSCOPIC CHOLECYSTECTOMY WITH INTRAOPERATIVE CHOLANGIOGRAM (N/A Abdomen)     Patient location during evaluation: PACU Anesthesia Type: General Level of consciousness: awake and alert Pain management: pain level controlled Vital Signs Assessment: post-procedure vital signs reviewed and stable Respiratory status: spontaneous breathing, nonlabored ventilation, respiratory function stable and patient connected to nasal cannula oxygen Cardiovascular status: blood pressure returned to baseline and stable Postop Assessment: no apparent nausea or vomiting Anesthetic complications: no    Last Vitals:  Vitals:   11/03/19 1520 11/03/19 1530  BP:  (!) 99/54  Pulse:  67  Resp: 20 (!) 25  Temp:    SpO2:  100%    Last Pain:  Vitals:   11/03/19 1520  TempSrc:   PainSc: 0-No pain                 Lidia Collum

## 2019-11-03 NOTE — Transfer of Care (Signed)
Immediate Anesthesia Transfer of Care Note  Patient: Jeanne Frederick  Procedure(s) Performed: LAPAROSCOPIC CHOLECYSTECTOMY WITH INTRAOPERATIVE CHOLANGIOGRAM (N/A Abdomen)  Patient Location: PACU  Anesthesia Type:General  Level of Consciousness: awake, alert  and oriented  Airway & Oxygen Therapy: Patient Spontanous Breathing and Patient connected to face mask oxygen  Post-op Assessment: Report given to RN and Post -op Vital signs reviewed and stable  Post vital signs: Reviewed and stable  Last Vitals:  Vitals Value Taken Time  BP 107/63 11/03/19 1519  Temp    Pulse 76 11/03/19 1522  Resp 23 11/03/19 1522  SpO2 100 % 11/03/19 1522  Vitals shown include unvalidated device data.  Last Pain:  Vitals:   11/03/19 1027  TempSrc: Temporal  PainSc: 0-No pain      Patients Stated Pain Goal: 4 ([id number])  Complications: No apparent anesthesia complications

## 2019-11-03 NOTE — Op Note (Signed)
Laparoscopic Cholecystectomy with IOC Procedure Note  Indications: This is a healthy 24 year old female with a history of acquired allergy to Pork and beef presents with a one-year history of intermittent epigastric pain. Her symptoms tend to be postprandial. Her pain is associated with a burning sensation heading up into her lower chest as well as nausea, bloating, and copious diarrhea. She denies any vomiting. Her symptoms tend to get worse with greasy foods. The patient has had a very extensive GI workup over the last several months. Gluten sensitivity was ruled out. Stool studies, CT scan, ultrasound, EGD, colonoscopy were all unremarkable. She had a HIDA scan performed on 10/03/19 that showed a low normal ejection fraction of 46% with normal greater than 33%. She did have some mild symptoms proximally 30 minutes after her study after ingestion of the Ensure. The patient is frustrated by the lack of an answer for her continued symptoms.  Upon further questioning of the patient, she has not really changed her diet. She continues to eat a fair amount of fast food chicken as well as [place] burgers. She has not really lost any weight over the last several months despite his continued symptoms. Her father had his gallbladder removed and reportedly did not have gallstones. Due to the lack of answers, the patient has been referred to me to discuss elective cholecystectomy based on her symptoms alone. She currently is not on any GI medications.  In June 2020, liver function tests, lipase, amylase were all normal.  I spent some additional time with the patient discussing her upcoming procedure. Obviously, it is unusual to pursue cholecystectomy with the lack of objective findings on imaging. However, I feel that her symptoms are classic for gallbladder disease. Despite a low normal gallbladder ejection fraction, the patient did have some symptoms after ingesting her Ensure. She understands  that cholecystectomy may not alleviate all of her symptoms but I do feel that she will likely benefit from cholecystectomy. The patient is eager to pursue surgery.  Pre-operative Diagnosis: Epigastric pain  Post-operative Diagnosis: Chronic cholecystitis  Surgeon: Maia Petties   Assistants: none  Anesthesia: General endotracheal anesthesia  ASA Class: 1  Procedure Details  The patient was seen again in the Holding Room. The risks, benefits, complications, treatment options, and expected outcomes were discussed with the patient. The possibilities of reaction to medication, pulmonary aspiration, perforation of viscus, bleeding, recurrent infection, finding a normal gallbladder, the need for additional procedures, failure to diagnose a condition, the possible need to convert to an open procedure, and creating a complication requiring transfusion or operation were discussed with the patient. The likelihood of improving the patient's symptoms with return to their baseline status is good.  The patient and/or family concurred with the proposed plan, giving informed consent. The site of surgery properly noted. The patient was taken to Operating Room, identified as [place] and the procedure verified as Laparoscopic Cholecystectomy with Intraoperative Cholangiogram. A Time Out was held and the above information confirmed.  Prior to the induction of general anesthesia, antibiotic prophylaxis was administered. General endotracheal anesthesia was then administered and tolerated well. After the induction, the abdomen was prepped with Chloraprep and draped in the sterile fashion. The patient was positioned in the supine position.  Local anesthetic agent was injected into the skin near the umbilicus and an incision made. We dissected down to the abdominal fascia with blunt dissection.  The fascia was incised vertically and we entered the peritoneal cavity bluntly.  A pursestring suture of  0-Vicryl  was placed around the fascial opening.  The Hasson cannula was inserted and secured with the stay suture.  Pneumoperitoneum was then created with CO2 and tolerated well without any adverse changes in the patient's vital signs. An 11-mm port was placed in the subxiphoid position.  Two 5-mm ports were placed in the right upper quadrant. All skin incisions were infiltrated with a local anesthetic agent before making the incision and placing the trocars.   We positioned the patient in reverse Trendelenburg, tilted slightly to the patient's left.  The gallbladder was identified, the fundus grasped and retracted cephalad. The gallbladder is distended and appears larger than normal.  The wall is not thickened, but there are some flimsy omental adhesions to the gallbladder.  Adhesions were lysed bluntly and with the electrocautery where indicated, taking care not to injure any adjacent organs or viscus. The infundibulum was grasped and retracted laterally, exposing the peritoneum overlying the triangle of Calot. This was then divided and exposed in a blunt fashion. A critical view of the cystic duct and cystic artery was obtained.  The cystic duct was clearly identified and bluntly dissected circumferentially. The cystic duct was ligated with a clip distally.   An incision was made in the cystic duct and the [facility] cholangiogram catheter introduced. The catheter was secured using a clip. A cholangiogram was then obtained which showed good visualization of the distal and proximal biliary tree with no sign of filling defects or obstruction.  Contrast flowed easily into the duodenum. The catheter was then removed.   The cystic duct was then ligated with clips and divided. The cystic artery was identified, dissected free, ligated with clips and divided as well.   The gallbladder was dissected from the liver bed in retrograde fashion with the electrocautery. The gallbladder was removed and placed in an Endocatch sac. The  liver bed was irrigated and inspected. Hemostasis was achieved with the electrocautery. Copious irrigation was utilized and was repeatedly aspirated until clear.  The gallbladder and Endocatch sac were then removed through the umbilical port site.  The pursestring suture was used to close the umbilical fascia.    We again inspected the right upper quadrant for hemostasis.  Pneumoperitoneum was released as we removed the trocars.  4-0 Monocryl was used to close the skin.   Benzoin, steri-strips, and clean dressings were applied. The patient was then extubated and brought to the recovery room in stable condition. Instrument, sponge, and needle counts were correct at closure and at the conclusion of the case.   Findings: Cholecystitis without Cholelithiasis  Estimated Blood Loss: Minimal         Drains: none         Specimens: Gallbladder           Complications: None; patient tolerated the procedure well.         Disposition: PACU - hemodynamically stable.         Condition: stable  Imogene Burn. Georgette Dover, MD, [facility] Surgery  General/ Trauma Surgery   11/03/2019 3:37 PM

## 2019-11-03 NOTE — Interval H&P Note (Signed)
History and Physical Interval Note:  11/03/2019 12:13 PM  Jeanne Frederick  has presented today for surgery, with the diagnosis of CHRONIC EPIGASTRIC PAIN/DIARRHEA.  The various methods of treatment have been discussed with the patient and family. After consideration of risks, benefits and other options for treatment, the patient has consented to  Procedure(s): LAPAROSCOPIC CHOLECYSTECTOMY WITH INTRAOPERATIVE CHOLANGIOGRAM (N/A) as a surgical intervention.  The patient's history has been reviewed, patient examined, no change in status, stable for surgery.  I have reviewed the patient's chart and labs.  Questions were answered to the patient's satisfaction.     Maia Petties

## 2019-11-04 ENCOUNTER — Encounter: Payer: Self-pay | Admitting: *Deleted

## 2019-11-04 LAB — SURGICAL PATHOLOGY

## 2020-02-24 ENCOUNTER — Ambulatory Visit: Payer: BC Managed Care – PPO | Admitting: Family Medicine

## 2020-03-09 ENCOUNTER — Telehealth: Payer: Self-pay

## 2020-03-09 NOTE — Telephone Encounter (Signed)
-----   Message from Irving Copas., MD sent at 03/09/2020  5:12 AM EDT ----- Dr. Georgette Dover, Thank you for reaching out. I was hopeful that if the gallbladder was her issue for her pain that it would be helpful for its removal. We can certainly see her back in regards to helping her bowel habits. Greg Cratty, can you find an opening in the next few weeks with myself or one of our APPs for follow up of her change in bowel habits. Please let Dr. Georgette Dover and I know when the patient is scheduled for. Thank you. GM ----- Message ----- From: Donnie Mesa, MD Sent: 03/08/2020   5:08 PM EDT To: Irving Copas., MD  I saw this lady back in January for chronic diarrhea and a low-normal GB EF.  I went ahead and took out her gallbladder and her diarrhea improved for a few months.  Over the last two months, she has resumed frequent diarrhea.  I started her on Questran Light and after only day of QL, she had constipation for 3 days!  I've asked her to just take it once a day and she will probably need to self-titrate to find a regimen that slows her diarrhea but avoids constipation.  Would you mind seeing her again, since it doesn't appear that cholecystectomy was the answer to her long-standing diarrhea?  Thanks  Tsuei

## 2020-03-09 NOTE — Telephone Encounter (Signed)
The pt has been scheduled to see Tye Savoy NP on 03/21/20 at 2 pm.  The patient has been notified of this information and all questions answered.

## 2020-03-21 ENCOUNTER — Ambulatory Visit (INDEPENDENT_AMBULATORY_CARE_PROVIDER_SITE_OTHER): Payer: No Typology Code available for payment source | Admitting: Nurse Practitioner

## 2020-03-21 ENCOUNTER — Encounter: Payer: Self-pay | Admitting: Nurse Practitioner

## 2020-03-21 VITALS — BP 108/60 | HR 92 | Ht 63.0 in | Wt 171.4 lb

## 2020-03-21 DIAGNOSIS — R197 Diarrhea, unspecified: Secondary | ICD-10-CM

## 2020-03-21 MED ORDER — DIPHENOXYLATE-ATROPINE 2.5-0.025 MG PO TABS: 1.0000 | ORAL_TABLET | Freq: Every morning | ORAL | 2 refills | Status: DC

## 2020-03-21 NOTE — Progress Notes (Signed)
IMPRESSION and PLAN:    #Chronic cholecystitis status post cholecystectomy January 2021  #Chronic diarrhea --Present prior to cholecystectomy, improved for a few months now back with recurrent diarrhea since April. --She is status post EGD and colonoscopy July 2020 for abdominal pain and loose stool.  Small bowel biopsies negative, random colon biopsies negative.  Negative  stool studies June 2020 and again August 2020.  Fecal elastase normal --Some of the diarrhea may be related to post-cholecystectomy state but she was having some loose stool prior with negative workup so suspect functional component. Hopefully she will not require anti-diarrheals long term.  --For now she is having difficulty titrating Prevalite as TID causes constipation, once daily is inadequate. Offered BID dosing but it gas increased intestinal gas. Will stop cholestyramine and try Lomotil q day. If not improvement in a few days she can titrate to BID       HPI:    Primary GI: Justice Britain, MD  Chief complaint : diarrhea   This patient is a 24 year old female who saw Alonza Bogus, [place] on 09/26/2019 for evaluation of upper abdominal pain and loose stool.  She had a several GI complaints in the past and undergone extensive work-up with EGD, colonoscopy, CT scan, ultrasound, lab studies, and stool studies which had all been negative.  Her symptoms were felt to be most likely functional. Sent for HIDA scan which was normal with normal gallbladder ejection fraction, no obstructive process.  Patient was frustrated by the lack of diagnosis.  She was referred to [facility] and underwent a laparoscopic cholecystectomy on 11/03/2019.  Gallbladder pathology compatible with chronic cholecystitis   INTERVAL HISTORY:    [name] says she had diarrhea for a year leading up to her cholecystectomy, diarrhea resolved around the time of her gallbladder surgery then recurred in April.  She is having 5-6 bowel movements a day, mainly  postprandial.  This is about the frequency of diarrhea prior to cholecystectomy.  She cannot correlate the diarrhea with any certain type of foods such as dairy or gluten.  She has not had any recent antibiotics, no med changes.  Been taking cholestyramine.  It was originally started it 3 times daily but caused constipation.  She stopped the Prevalite but then surgery restarted it at once daily.  Once daily dosing is not helping and causes intestinal gas.  Patient does not want to be reliant on medications for diarrhea she wants to fix the underlying problem  Review of systems:     No chest pain, no SOB, no fevers, no urinary sx   Past Medical History:  Diagnosis Date  . Allergy   . Family history of adverse reaction to anesthesia    tachycardia during surgery- father  . GERD (gastroesophageal reflux disease)   . Kidney infection   . Kidney stones     Patient's surgical history, family medical history, social history, medications and allergies were all reviewed in Epic   Creatinine clearance cannot be calculated (Patient's most recent lab result is older than the maximum 21 days allowed.)  Current Outpatient Medications  Medication Sig Dispense Refill  . norethindrone-ethinyl estradiol (JUNEL FE,GILDESS FE,LOESTRIN FE) 1-20 MG-MCG tablet Take 1 tablet by mouth daily.    Marland Kitchen PREVALITE 4 g packet Take 1 packet by mouth daily.    Marland Kitchen PROLENSA 0.07 % SOLN Place 1 drop into the right eye daily.    Marland Kitchen EPINEPHrine 0.15 MG/0.15ML IJ injection Inject 0.15 mLs (0.15 mg total) into the  muscle as needed for anaphylaxis. (Patient not taking: Reported on 03/21/2020) 2 each 3   No current facility-administered medications for this visit.    Physical Exam:     BP 108/60 (BP Location: Left Arm, Patient Position: Sitting, Cuff Size: Normal)   Pulse 92   Ht 5\' 3"  (1.6 m) Comment: height measured without shoes  Wt 171 lb 6 oz (77.7 kg)   LMP 02/08/2020   BMI 30.36 kg/m   GENERAL:  Pleasant female in  NAD PSYCH: : Cooperative, normal affect CARDIAC:  RRR PULM: Normal respiratory effort, lungs CTA bilaterally, no wheezing ABDOMEN:  Nondistended, soft, mild diffuse lower abdominal tenderness. No obvious masses, no hepatomegaly,  normal bowel sounds SKIN:  turgor, no lesions seen Musculoskeletal:  Normal muscle tone, normal strength NEURO: Alert and oriented x 3, no focal neurologic deficits   Tye Savoy , NP 03/21/2020, 2:11 PM

## 2020-03-21 NOTE — Patient Instructions (Addendum)
  If you are age 25 or older, your body mass index should be between 23-30. Your Body mass index is 30.36 kg/m. If this is out of the aforementioned range listed, please consider follow up with your Primary Care Provider.  If you are age 10 or younger, your body mass index should be between 19-25. Your Body mass index is 30.36 kg/m. If this is out of the aformentioned range listed, please consider follow up with your Primary Care Provider.   START Lomotil 1 tablet in the morning. If you see no improvements with 1 tablet then increase to 1 tablet twice daily for 3 days.  Call the office if you see no improvements.   Follow up as needed.

## 2020-03-22 NOTE — Progress Notes (Signed)
Attending Physician's Attestation   I have reviewed the chart.   I agree with the Advanced Practitioner's note, impression, and recommendations with any updates as below.  Hopefully she can tolerate the titration of Prevalite/cholestyramine.  If not may want to try WelChol.  Certainly if we have not ruled out SIBO we should consider that as well in the future if things persist.  Justice Britain, MD [facility]astro[facility] Advanced Endoscopy Office # [phone]

## 2020-04-05 ENCOUNTER — Encounter: Payer: Self-pay | Admitting: Family Medicine

## 2020-04-05 ENCOUNTER — Ambulatory Visit (INDEPENDENT_AMBULATORY_CARE_PROVIDER_SITE_OTHER): Payer: No Typology Code available for payment source | Admitting: Family Medicine

## 2020-04-05 ENCOUNTER — Other Ambulatory Visit: Payer: Self-pay

## 2020-04-05 VITALS — BP 129/78 | HR 103 | Temp 97.8°F | Resp 20 | Ht 63.0 in | Wt 171.2 lb

## 2020-04-05 DIAGNOSIS — K915 Postcholecystectomy syndrome: Secondary | ICD-10-CM

## 2020-04-06 ENCOUNTER — Encounter: Payer: Self-pay | Admitting: Family Medicine

## 2020-04-06 NOTE — Progress Notes (Signed)
Subjective:  Patient ID: Jeanne Frederick, female    DOB: 11-26-1995  Age: 24 y.o. MRN: [id number]  CC: No chief complaint on file.   HPI [place] presents for continued chronic diarrhea.  She has been through multiple tests.  She has had upper GI and colonoscopy about a year ago.  She had a HIDA scan and subsequently had a cholecystectomy that showed that the gallbladder was swollen and discolored.  It looked inflamed according to the surgeon who removed it.  She is concerned that her CRP is high.  She has been having bowel movements increased to 6 times a day and watery over the last month.  Her symptoms have returned.  When she went back to her GI doctor they were unable to provide any further assistance and she would like to transfer to a new GI practice.  Depression screen [facility] 2/9 04/05/2020 07/27/2019 12/29/2018  Decreased Interest 0 0 0  Down, Depressed, Hopeless 0 0 0  PHQ - 2 Score 0 0 0    History [name] has a past medical history of Allergy, Family history of adverse reaction to anesthesia, GERD (gastroesophageal reflux disease), Kidney infection, and Kidney stones.   She has a past surgical history that includes Cholecystectomy (N/A, 11/03/2019).   Her family history includes Diabetes in her maternal grandmother.She reports that she has never smoked. She has never used smokeless tobacco. She reports that she does not drink alcohol and does not use drugs.    ROS Review of Systems  Constitutional: Negative.   HENT: Negative.   Eyes: Negative for visual disturbance.  Respiratory: Negative for shortness of breath.   Cardiovascular: Negative for chest pain.  Gastrointestinal: Positive for abdominal pain and diarrhea.  Musculoskeletal: Negative for arthralgias.    Objective:  BP 129/78   Pulse (!) 103   Temp 97.8 F (36.6 C) (Temporal)   Resp 20   Ht 5\' 3"  (1.6 m)   Wt 171 lb 4 oz (77.7 kg)   SpO2 99%   BMI 30.34 kg/m   BP Readings from Last 3 Encounters:   04/05/20 129/78  03/21/20 108/60  11/03/19 106/63    Wt Readings from Last 3 Encounters:  04/05/20 171 lb 4 oz (77.7 kg)  03/21/20 171 lb 6 oz (77.7 kg)  11/03/19 166 lb 3.6 oz (75.4 kg)     Physical Exam Constitutional:      General: She is not in acute distress.    Appearance: She is well-developed.  Cardiovascular:     Rate and Rhythm: Normal rate and regular rhythm.  Pulmonary:     Breath sounds: Normal breath sounds.  Skin:    General: Skin is warm and dry.  Neurological:     Mental Status: She is alert and oriented to person, place, and time.       Assessment & Plan:   Diagnoses and all orders for this visit:  Postcholecystectomy syndrome -     Ambulatory referral to Gastroenterology       I have discontinued [name]'s Prevalite. I am also having her maintain her norethindrone-ethinyl estradiol, EPINEPHrine, Prolensa, and diphenoxylate-atropine.  Allergies as of 04/05/2020      Reactions   Lac Bovis Swelling, Other (See Comments)   Beef-derived Products    Alpha Gal Allergy   Other Other (See Comments)   Cannot take capsules of any kind d/t alpha gal disease   Pork-derived Products    Alpha Gal Allergy      Medication List  Accurate as of April 05, 2020 11:59 PM. If you have any questions, ask your nurse or doctor.        STOP taking these medications   Prevalite 4 g packet Generic drug: cholestyramine light Stopped by: Claretta Fraise, MD     TAKE these medications   diphenoxylate-atropine 2.5-0.025 MG tablet Commonly known as: LOMOTIL Take 1 tablet by mouth in the morning.   EPINEPHrine 0.15 MG/0.15ML injection Commonly known as: ADRENACLICK Inject 6.00 mLs (0.15 mg total) into the muscle as needed for anaphylaxis.   norethindrone-ethinyl estradiol 1-20 MG-MCG tablet Commonly known as: LOESTRIN FE Take 1 tablet by mouth daily.   Prolensa 0.07 % Soln Generic drug: Bromfenac Sodium Place 1 drop into the right eye  daily.        Follow-up: No follow-ups on file.  Claretta Fraise, M.D.

## 2020-04-25 IMAGING — CT CT ABDOMEN AND PELVIS WITH CONTRAST
2 of 4 series · 15 of 46 positions shown, 17 images · IV contrast (OMNIPAQUE 300)
Comparison: 02/21/2018

CLINICAL DATA: Mid epigastric pain.  Anemia.

EXAM:
CT ABDOMEN AND PELVIS WITH CONTRAST
TECHNIQUE: Multidetector CT imaging of the abdomen and pelvis was performed
using the standard protocol following bolus administration of
intravenous contrast.
CONTRAST:  100mL OMNIPAQUE IOHEXOL 300 MG/ML  SOLN

[Series 2: abd/pel w · axial · 0.68mm/px · z∈[-428,-38]mm · 12 of 90 slices shown, 14 images]
[im 8/90  soft-tissue]
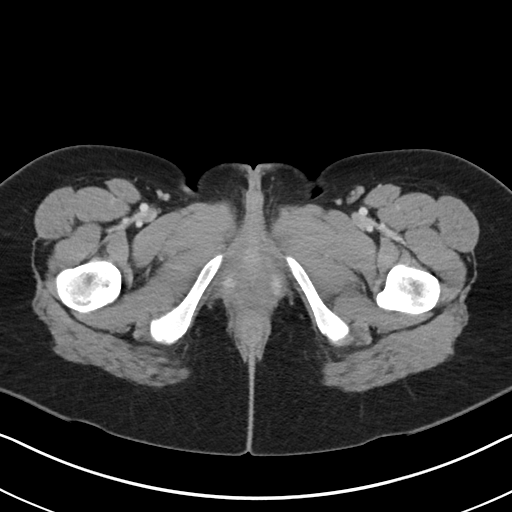
[im 8/90  bone]
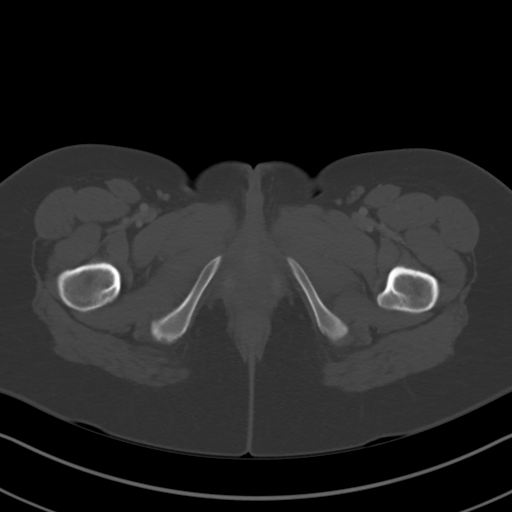
[im 15/90  soft-tissue]
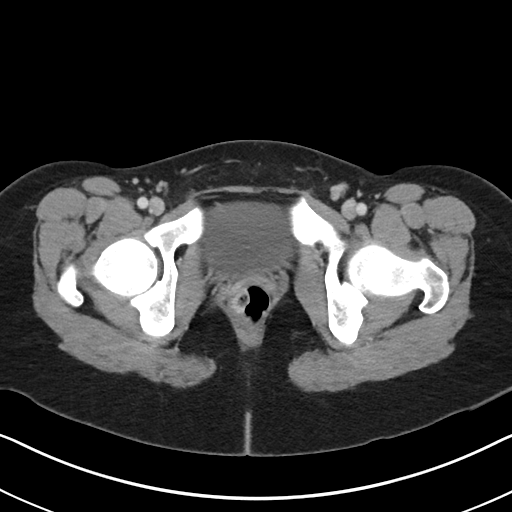
[im 22/90  soft-tissue]
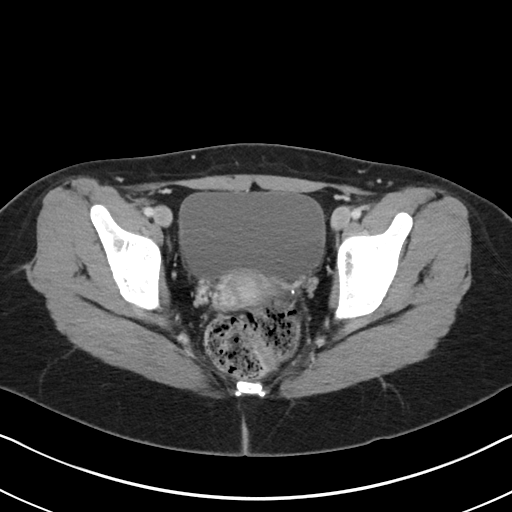
[im 29/90  soft-tissue]
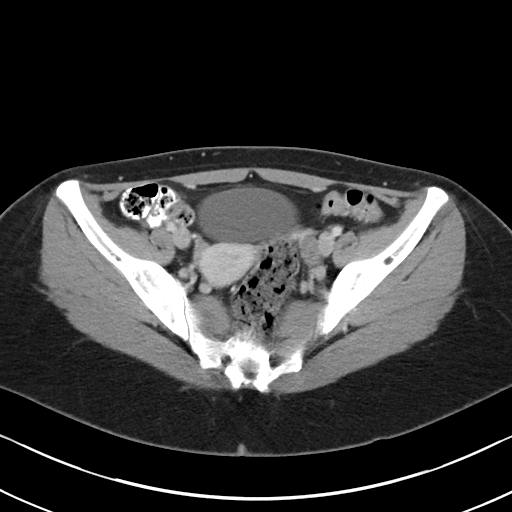
[im 36/90  soft-tissue]
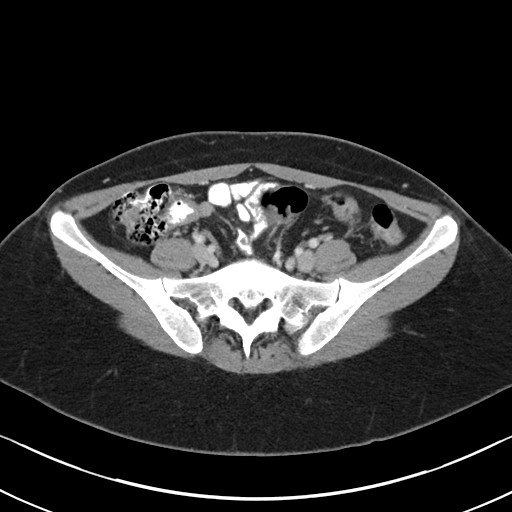
[im 43/90  soft-tissue]
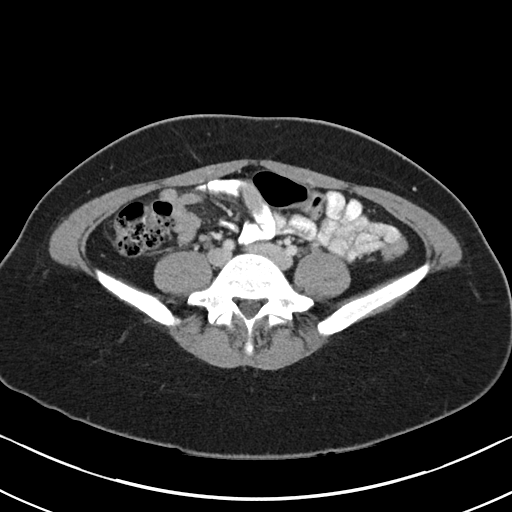
[im 50/90  soft-tissue]
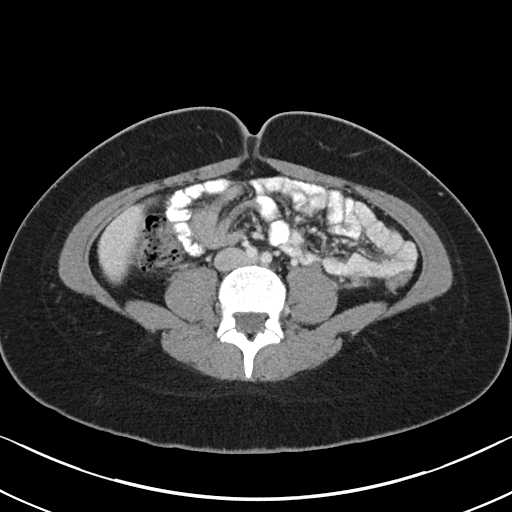
[im 57/90  soft-tissue]
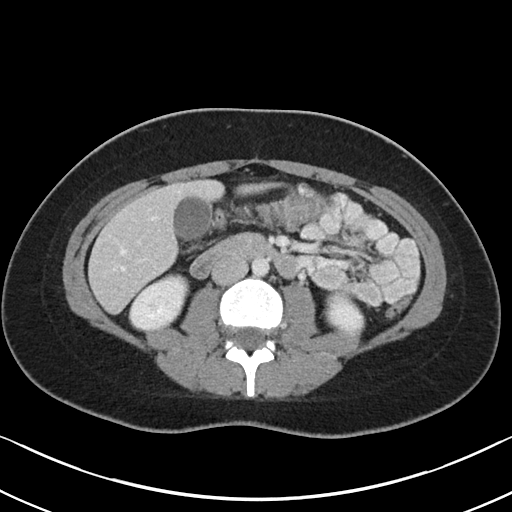
[im 65/90  soft-tissue]
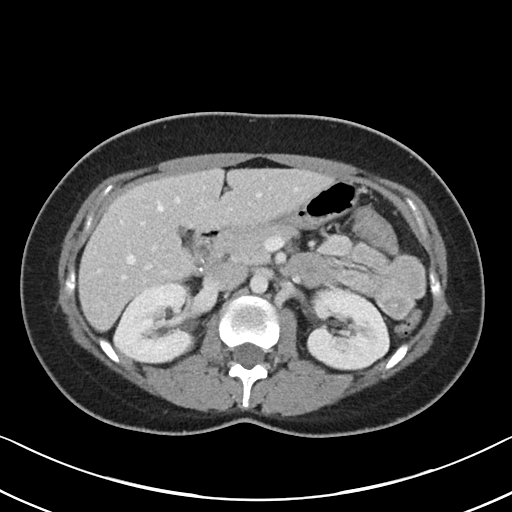
[im 65/90  bone]
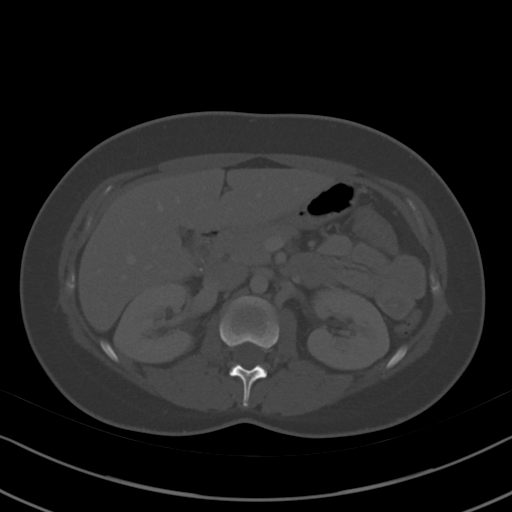
[im 72/90  soft-tissue]
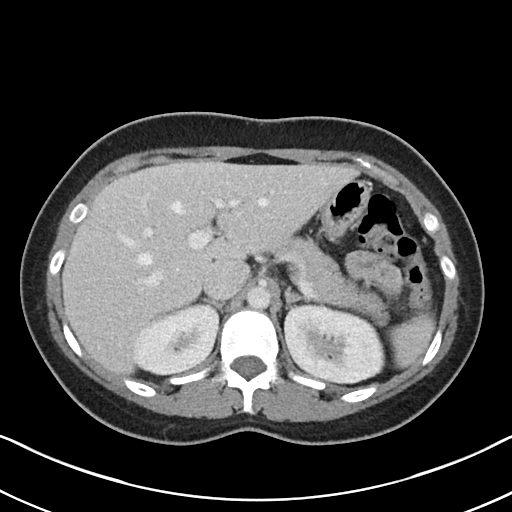
[im 79/90  soft-tissue]
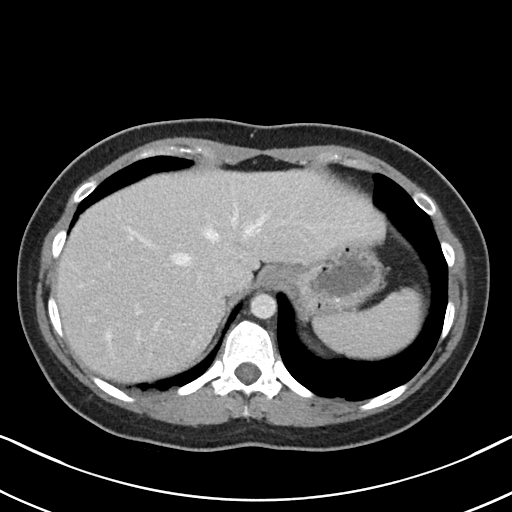
[im 86/90  soft-tissue]
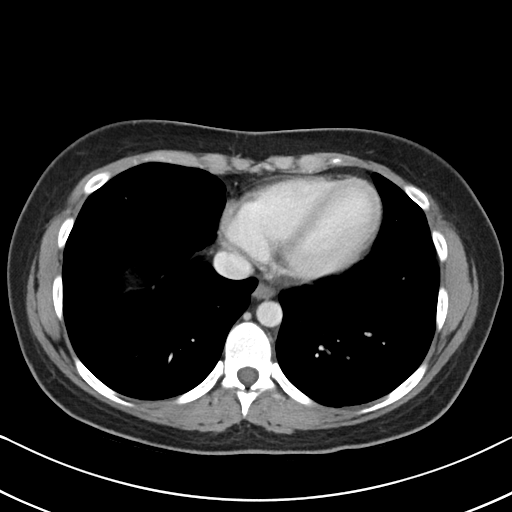

[Series 5: abd/pel w st · coronal · 0.61mm/px · 3 of 65 slices shown]
[im 22/65  soft-tissue]
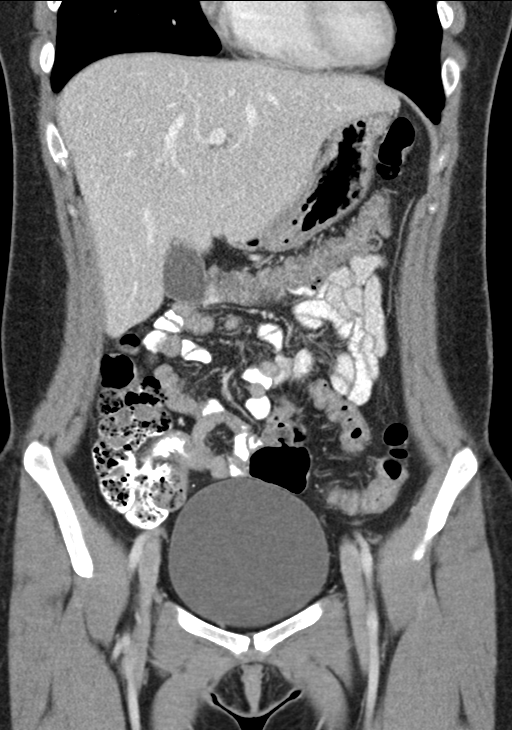
[im 29/65  soft-tissue]
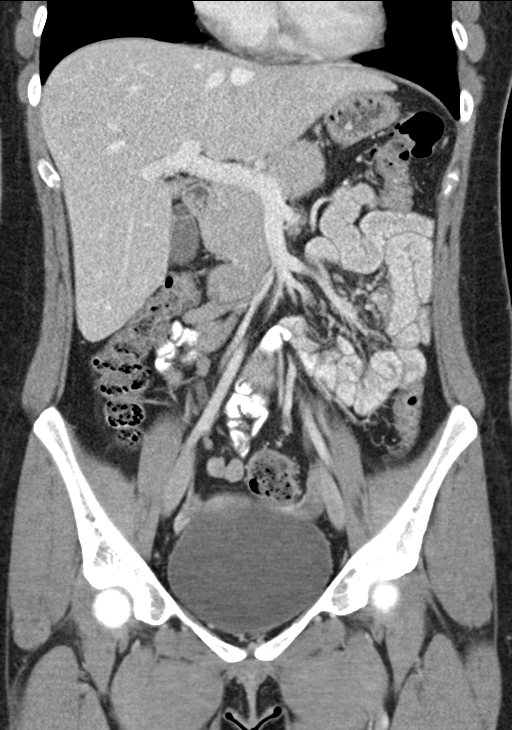
[im 36/65  soft-tissue]
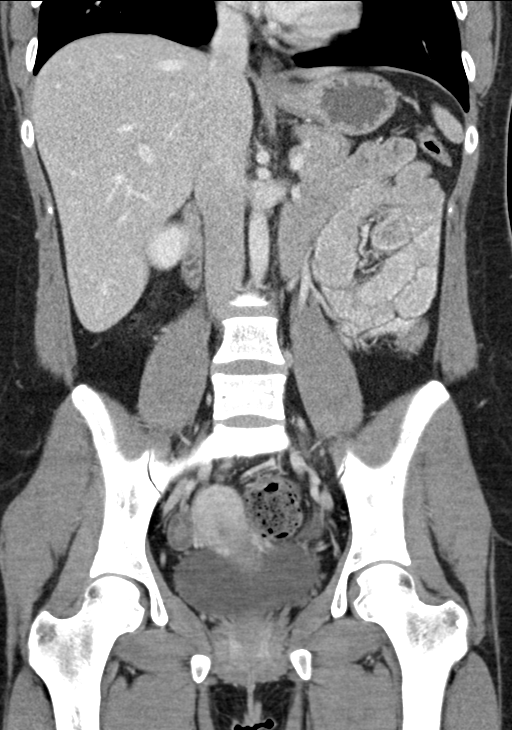

[15 of 46 positions shown; findings below may reference images not displayed]

FINDINGS: Lower chest: Unremarkable.

Hepatobiliary: No suspicious focal abnormality within the liver
parenchyma. There is no evidence for gallstones, gallbladder wall
thickening, or pericholecystic fluid. No intrahepatic or
extrahepatic biliary dilation.

Pancreas: No focal mass lesion. No dilatation of the main duct. No
intraparenchymal cyst. No peripancreatic edema.

Spleen: No splenomegaly. No focal mass lesion.

Adrenals/Urinary Tract: No adrenal nodule or mass. Right kidney
unremarkable. Punctate nonobstructing stone identified lower pole
left kidney (image 45/coronal series 5). No evidence for
hydroureter. The urinary bladder appears normal for the degree of
distention.

Stomach/Bowel: Stomach is unremarkable. No gastric wall thickening.
No evidence of outlet obstruction. Duodenum is normally positioned
as is the ligament of Treitz. No small bowel wall thickening. No
small bowel dilatation. The terminal ileum is normal. The appendix
is normal. Colon is decompressed.

Vascular/Lymphatic: No abdominal aortic aneurysm. No abdominal
aortic atherosclerotic calcification. There is no gastrohepatic or
hepatoduodenal ligament lymphadenopathy. No intraperitoneal or
retroperitoneal lymphadenopathy. No pelvic sidewall lymphadenopathy.

Reproductive: The uterus is unremarkable.  There is no adnexal mass.

Other: No intraperitoneal free fluid.

Musculoskeletal: No worrisome lytic or sclerotic osseous
abnormality.
IMPRESSION: 1. No acute findings in the abdomen or pelvis. No findings to
explain the patient's history of pain.
2. Punctate nonobstructing stone lower pole left kidney.

## 2020-05-31 ENCOUNTER — Encounter: Payer: No Typology Code available for payment source | Admitting: Family Medicine

## 2020-06-06 ENCOUNTER — Ambulatory Visit (INDEPENDENT_AMBULATORY_CARE_PROVIDER_SITE_OTHER): Payer: No Typology Code available for payment source | Admitting: Family Medicine

## 2020-06-06 ENCOUNTER — Encounter: Payer: Self-pay | Admitting: Family Medicine

## 2020-06-06 ENCOUNTER — Other Ambulatory Visit: Payer: Self-pay

## 2020-06-06 VITALS — BP 104/71 | HR 91 | Temp 97.9°F | Resp 20 | Ht 63.0 in | Wt 174.1 lb

## 2020-06-06 DIAGNOSIS — Z0000 Encounter for general adult medical examination without abnormal findings: Secondary | ICD-10-CM

## 2020-06-06 DIAGNOSIS — Z87892 Personal history of anaphylaxis: Secondary | ICD-10-CM

## 2020-06-06 MED ORDER — EPINEPHRINE 0.15 MG/0.15ML IJ SOAJ: 0.1500 mg | INTRAMUSCULAR | 3 refills | Status: DC | PRN

## 2020-06-06 NOTE — Progress Notes (Signed)
Subjective:  Patient ID: Jeanne Frederick, female    DOB: Mar 03, 1996  Age: 24 y.o. MRN: [id number]  CC: Annual Exam   HPI Taisa Deloria presents for annual physical examination without gynecological component  Depression screen [facility] 2/9 06/06/2020 04/05/2020 07/27/2019  Decreased Interest 0 0 0  Down, Depressed, Hopeless 0 0 0  PHQ - 2 Score 0 0 0    History [name] has a past medical history of Allergy, Family history of adverse reaction to anesthesia, GERD (gastroesophageal reflux disease), Kidney infection, and Kidney stones.   She has a past surgical history that includes Cholecystectomy (N/A, 11/03/2019).   Her family history includes Diabetes in her maternal grandmother.She reports that she has never smoked. She has never used smokeless tobacco. She reports that she does not drink alcohol and does not use drugs.    ROS Review of Systems  Constitutional: Negative for chills, diaphoresis and fever.  HENT: Negative for congestion, ear pain, hearing loss, nosebleeds, sore throat and tinnitus.   Eyes: Negative for photophobia, pain, discharge and redness.  Respiratory: Negative for cough, shortness of breath and wheezing.   Cardiovascular: Negative for chest pain, palpitations and leg swelling.  Gastrointestinal: Negative for abdominal pain, blood in stool, constipation, diarrhea, nausea and vomiting.  Endocrine: Negative for polydipsia.  Genitourinary: Negative for dysuria, flank pain, frequency, hematuria and urgency.  Musculoskeletal: Negative for back pain, myalgias and neck pain.  Skin: Negative for rash.  Allergic/Immunologic: Negative for environmental allergies.  Neurological: Negative for dizziness, tremors, seizures, weakness and headaches.  Hematological: Does not bruise/bleed easily.  Psychiatric/Behavioral: Negative for hallucinations and suicidal ideas. The patient is not nervous/anxious.     Objective:  BP 104/71   Pulse 91   Temp 97.9 F (36.6 C)  (Temporal)   Resp 20   Ht 5\' 3"  (1.6 m)   Wt 174 lb 2 oz (79 kg)   SpO2 100%   BMI 30.84 kg/m   BP Readings from Last 3 Encounters:  06/06/20 104/71  04/05/20 129/78  03/21/20 108/60    Wt Readings from Last 3 Encounters:  06/06/20 174 lb 2 oz (79 kg)  04/05/20 171 lb 4 oz (77.7 kg)  03/21/20 171 lb 6 oz (77.7 kg)     Physical Exam Constitutional:      General: She is not in acute distress.    Appearance: She is well-developed.  HENT:     Head: Normocephalic and atraumatic.     Right Ear: External ear normal.     Left Ear: External ear normal.     Nose: Nose normal.  Eyes:     Conjunctiva/sclera: Conjunctivae normal.     Pupils: Pupils are equal, round, and reactive to light.  Neck:     Thyroid: No thyromegaly.  Cardiovascular:     Rate and Rhythm: Normal rate and regular rhythm.     Heart sounds: Normal heart sounds. No murmur heard.   Pulmonary:     Effort: Pulmonary effort is normal. No respiratory distress.     Breath sounds: Normal breath sounds. No wheezing or rales.  Abdominal:     General: Bowel sounds are normal. There is no distension.     Palpations: Abdomen is soft.     Tenderness: There is no abdominal tenderness.  Musculoskeletal:     Cervical back: Normal range of motion and neck supple.  Lymphadenopathy:     Cervical: No cervical adenopathy.  Skin:    General: Skin is warm and dry.  Neurological:  Mental Status: She is alert and oriented to person, place, and time.     Deep Tendon Reflexes: Reflexes are normal and symmetric.  Psychiatric:        Behavior: Behavior normal.        Thought Content: Thought content normal.        Judgment: Judgment normal.       Assessment & Plan:   [name] was seen today for annual exam.  Diagnoses and all orders for this visit:  Well adult exam  History of anaphylaxis -     EPINEPHrine 0.15 MG/0.15ML IJ injection; Inject 0.15 mLs (0.15 mg total) into the muscle as needed for anaphylaxis.        I have discontinued [name] Bansal's Prolensa and diphenoxylate-atropine. I am also having her maintain her norethindrone-ethinyl estradiol and EPINEPHrine.  Allergies as of 06/06/2020      Reactions   Lac Bovis Swelling, Other (See Comments)   Beef-derived Products    Alpha Gal Allergy   Other Other (See Comments)   Cannot take capsules of any kind d/t alpha gal disease   Pork-derived Products    Alpha Gal Allergy      Medication List       Accurate as of June 06, 2020 11:59 PM. If you have any questions, ask your nurse or doctor.        STOP taking these medications   diphenoxylate-atropine 2.5-0.025 MG tablet Commonly known as: LOMOTIL Stopped by: Claretta Fraise, MD   Prolensa 0.07 % Soln Generic drug: Bromfenac Sodium Stopped by: Claretta Fraise, MD     TAKE these medications   EPINEPHrine 0.15 MG/0.15ML injection Commonly known as: ADRENACLICK Inject 9.21 mLs (0.15 mg total) into the muscle as needed for anaphylaxis.   norethindrone-ethinyl estradiol 1-20 MG-MCG tablet Commonly known as: LOESTRIN FE Take 1 tablet by mouth daily.        Follow-up: Return in about 1 year (around 06/06/2021).  Claretta Fraise, M.D.

## 2020-06-10 ENCOUNTER — Encounter: Payer: Self-pay | Admitting: Family Medicine

## 2020-06-12 ENCOUNTER — Other Ambulatory Visit: Payer: Self-pay

## 2020-06-12 ENCOUNTER — Other Ambulatory Visit: Payer: No Typology Code available for payment source

## 2020-06-12 ENCOUNTER — Other Ambulatory Visit: Payer: Self-pay | Admitting: *Deleted

## 2020-06-12 DIAGNOSIS — Z0000 Encounter for general adult medical examination without abnormal findings: Secondary | ICD-10-CM

## 2020-06-13 ENCOUNTER — Other Ambulatory Visit: Payer: Self-pay | Admitting: Family Medicine

## 2020-06-13 LAB — CMP14+EGFR
ALT: 9 IU/L (ref 0–32)
AST: 16 IU/L (ref 0–40)
Albumin/Globulin Ratio: 1.6 (ref 1.2–2.2)
Albumin: 4.1 g/dL (ref 3.9–5.0)
Alkaline Phosphatase: 44 IU/L — ABNORMAL LOW (ref 48–121)
BUN/Creatinine Ratio: 13 (ref 9–23)
BUN: 8 mg/dL (ref 6–20)
Bilirubin Total: 0.3 mg/dL (ref 0.0–1.2)
CO2: 21 mmol/L (ref 20–29)
Calcium: 8.9 mg/dL (ref 8.7–10.2)
Chloride: 107 mmol/L — ABNORMAL HIGH (ref 96–106)
Creatinine, Ser: 0.64 mg/dL (ref 0.57–1.00)
GFR calc Af Amer: 144 mL/min/{1.73_m2} (ref >59)
GFR calc non Af Amer: 125 mL/min/{1.73_m2} (ref >59)
Globulin, Total: 2.5 g/dL (ref 1.5–4.5)
Glucose: 87 mg/dL (ref 65–99)
Potassium: 4.2 mmol/L (ref 3.5–5.2)
Sodium: 141 mmol/L (ref 134–144)
Total Protein: 6.6 g/dL (ref 6.0–8.5)

## 2020-06-13 LAB — VITAMIN D 25 HYDROXY (VIT D DEFICIENCY, FRACTURES): Vit D, 25-Hydroxy: 28.3 ng/mL — ABNORMAL LOW (ref 30.0–100.0)

## 2020-06-13 LAB — CBC WITH DIFFERENTIAL/PLATELET
Basophils Absolute: 0 10*3/uL (ref 0.0–0.2)
Basos: 0 %
EOS (ABSOLUTE): 0.1 10*3/uL (ref 0.0–0.4)
Eos: 1 %
Hematocrit: 40.6 % (ref 34.0–46.6)
Hemoglobin: 13.4 g/dL (ref 11.1–15.9)
Immature Grans (Abs): 0 10*3/uL (ref 0.0–0.1)
Immature Granulocytes: 0 %
Lymphocytes Absolute: 2.8 10*3/uL (ref 0.7–3.1)
Lymphs: 36 %
MCH: 31.4 pg (ref 26.6–33.0)
MCHC: 33 g/dL (ref 31.5–35.7)
MCV: 95 fL (ref 79–97)
Monocytes Absolute: 0.4 10*3/uL (ref 0.1–0.9)
Monocytes: 5 %
Neutrophils Absolute: 4.5 10*3/uL (ref 1.4–7.0)
Neutrophils: 58 %
Platelets: 270 10*3/uL (ref 150–450)
RBC: 4.27 x10E6/uL (ref 3.77–5.28)
RDW: 12 % (ref 11.7–15.4)
WBC: 7.8 10*3/uL (ref 3.4–10.8)

## 2020-06-13 LAB — VITAMIN B12: Vitamin B-12: 256 pg/mL (ref 232–1245)

## 2020-06-13 LAB — LIPID PANEL
Chol/HDL Ratio: 3.3 ratio (ref 0.0–4.4)
Cholesterol, Total: 166 mg/dL (ref 100–199)
HDL: 50 mg/dL (ref >39)
LDL Chol Calc (NIH): 94 mg/dL (ref 0–99)
Triglycerides: 121 mg/dL (ref 0–149)
VLDL Cholesterol Cal: 22 mg/dL (ref 5–40)

## 2020-06-13 MED ORDER — VITAMIN D (ERGOCALCIFEROL) 1.25 MG (50000 UNIT) PO CAPS: 50000.0000 [IU] | ORAL_CAPSULE | ORAL | 3 refills | Status: DC

## 2020-06-14 ENCOUNTER — Encounter: Payer: Self-pay | Admitting: Family Medicine

## 2020-06-19 MED ORDER — VITAMIN D3 ULTRA POTENCY 1.25 MG (50000 UT) PO TABS: 1.2500 mg | ORAL_TABLET | ORAL | 3 refills | Status: DC

## 2020-06-21 ENCOUNTER — Telehealth: Payer: Self-pay | Admitting: Family Medicine

## 2020-07-16 ENCOUNTER — Other Ambulatory Visit: Payer: Self-pay

## 2020-07-16 ENCOUNTER — Emergency Department (HOSPITAL_BASED_OUTPATIENT_CLINIC_OR_DEPARTMENT_OTHER)
Admission: EM | Admit: 2020-07-16 | Discharge: 2020-07-16 | Disposition: A | Discharge status: Home / Self Care | Payer: No Typology Code available for payment source | Attending: Emergency Medicine | Admitting: Emergency Medicine

## 2020-07-16 ENCOUNTER — Encounter (HOSPITAL_BASED_OUTPATIENT_CLINIC_OR_DEPARTMENT_OTHER): Payer: Self-pay | Admitting: *Deleted

## 2020-07-16 DIAGNOSIS — R059 Cough, unspecified: Secondary | ICD-10-CM | POA: Diagnosis not present

## 2020-07-16 DIAGNOSIS — R509 Fever, unspecified: Secondary | ICD-10-CM | POA: Insufficient documentation

## 2020-07-16 DIAGNOSIS — R0981 Nasal congestion: Secondary | ICD-10-CM | POA: Insufficient documentation

## 2020-07-16 DIAGNOSIS — J111 Influenza due to unidentified influenza virus with other respiratory manifestations: Secondary | ICD-10-CM

## 2020-07-16 DIAGNOSIS — R000 Tachycardia, unspecified: Secondary | ICD-10-CM | POA: Insufficient documentation

## 2020-07-16 DIAGNOSIS — Z20822 Contact with and (suspected) exposure to covid-19: Secondary | ICD-10-CM | POA: Insufficient documentation

## 2020-07-16 LAB — RESPIRATORY PANEL BY RT PCR (FLU A&B, COVID)
Influenza A by PCR: NEGATIVE
Influenza B by PCR: NEGATIVE
SARS Coronavirus 2 by RT PCR: NEGATIVE

## 2020-07-16 LAB — PREGNANCY, URINE: Preg Test, Ur: NEGATIVE

## 2020-07-16 NOTE — ED Triage Notes (Signed)
Fever of 100.9 at 0500; Sore throat and dry cough

## 2020-07-16 NOTE — Discharge Instructions (Addendum)
You need to quarantine until your Covid test results. If your Covid test is positive, you need to quarantine for at least 10 days but she also need to have improving symptoms. If your Covid test is negative, you need to stay at home until you are fever free for at least 48 hours. Return to the emergency room if you have any worsening symptoms.

## 2020-07-16 NOTE — ED Provider Notes (Signed)
[facility] EMERGENCY DEPARTMENT Provider Note   CSN: [id number] Arrival date & time: 07/16/20  2751     History Chief Complaint  Patient presents with  . Fever  . Sore Throat    Jeanne Frederick is a 24 y.o. female.  Patient is a 24 year old female with a history of GERD who presents with febrile illness. She reports a 2-day history of runny nose congestion sore throat associated with a dry cough. She has had some fevers as well. She denies any shortness of breath. Her cough is mostly nonproductive although she has been taking some Mucinex and has some clear mucus associated with this. She denies any chest pain. No vomiting or diarrhea. No nausea. She has been able to eat and drink okay. She has no known sick exposures. She has not been vaccinated for Covid.        Past Medical History:  Diagnosis Date  . Allergy   . Family history of adverse reaction to anesthesia    tachycardia during surgery- father  . GERD (gastroesophageal reflux disease)   . Kidney infection   . Kidney stones     Patient Active Problem List   Diagnosis Date Noted  . Right nephrolithiasis 02/22/2018  . Seasonal allergic rhinitis 12/28/2017  . Abnormality of heart beat 11/12/2016  . Atypical chest pain 10/09/2016    Past Surgical History:  Procedure Laterality Date  . CHOLECYSTECTOMY N/A 11/03/2019   Procedure: LAPAROSCOPIC CHOLECYSTECTOMY WITH INTRAOPERATIVE CHOLANGIOGRAM;  Surgeon: Donnie Mesa, MD;  Location: [facility];  Service: General;  Laterality: N/A;     OB History   No obstetric history on file.     Family History  Problem Relation Age of Onset  . Diabetes Maternal Grandmother   . Colon cancer Neg Hx   . Esophageal cancer Neg Hx   . Inflammatory bowel disease Neg Hx   . Liver disease Neg Hx   . Pancreatic cancer Neg Hx   . Rectal cancer Neg Hx   . Stomach cancer Neg Hx     Social History   Tobacco Use  . Smoking status: Never Smoker  .  Smokeless tobacco: Never Used  Vaping Use  . Vaping Use: Never used  Substance Use Topics  . Alcohol use: No  . Drug use: No    Home Medications Prior to Admission medications   Medication Sig Start Date End Date Taking? Authorizing Provider  norethindrone-ethinyl estradiol (JUNEL FE,GILDESS FE,LOESTRIN FE) 1-20 MG-MCG tablet Take 1 tablet by mouth daily.   Yes [provider]  colestipol (COLESTID) 1 g tablet Take 2 g by mouth daily. 06/19/20   [provider]  EPINEPHrine 0.15 MG/0.15ML IJ injection Inject 0.15 mLs (0.15 mg total) into the muscle as needed for anaphylaxis. 06/06/20   Claretta Fraise, MD    Allergies    Lac bovis, Beef-derived products, Other, and Pork-derived products  Review of Systems   Review of Systems  Constitutional: Positive for fever. Negative for chills, diaphoresis and fatigue.  HENT: Positive for congestion, rhinorrhea and sore throat. Negative for trouble swallowing and voice change.   Eyes: Negative.  Negative for photophobia.  Respiratory: Positive for cough. Negative for chest tightness and shortness of breath.   Cardiovascular: Negative for chest pain and leg swelling.  Gastrointestinal: Negative for abdominal pain, blood in stool, diarrhea, nausea and vomiting.  Genitourinary: Negative for difficulty urinating, flank pain, frequency and hematuria.  Musculoskeletal: Negative for arthralgias and back pain.  Skin: Negative for rash.  Neurological: Negative for dizziness, speech difficulty, weakness, numbness and headaches.    Physical Exam Updated Vital Signs BP 118/85 (BP Location: Right Arm)   Pulse (!) 110   Temp 98.5 F (36.9 C) (Oral)   Resp 20   Ht 5\' 3"  (1.6 m)   Wt 54.4 kg   LMP 05/09/2020   SpO2 96%   BMI 21.26 kg/m   Physical Exam Constitutional:      Appearance: She is well-developed.  HENT:     Head: Normocephalic and atraumatic.     Mouth/Throat:     Mouth: Mucous membranes are moist. No oral lesions.      Pharynx: No oropharyngeal exudate, posterior oropharyngeal erythema or uvula swelling.  Eyes:     Pupils: Pupils are equal, round, and reactive to light.  Neck:     Comments: No meningismus Cardiovascular:     Rate and Rhythm: Regular rhythm. Tachycardia present.     Heart sounds: Normal heart sounds.     Comments: Mild tachycardia Pulmonary:     Effort: Pulmonary effort is normal. No respiratory distress.     Breath sounds: Normal breath sounds. No wheezing or rales.  Chest:     Chest wall: No tenderness.  Abdominal:     General: Bowel sounds are normal.     Palpations: Abdomen is soft.     Tenderness: There is no abdominal tenderness. There is no guarding or rebound.  Musculoskeletal:        General: Normal range of motion.     Cervical back: Normal range of motion and neck supple.  Lymphadenopathy:     Cervical: No cervical adenopathy.  Skin:    General: Skin is warm and dry.     Findings: No rash.  Neurological:     Mental Status: She is alert and oriented to person, place, and time.     ED Results / Procedures / Treatments   Labs (all labs ordered are listed, but only abnormal results are displayed) Labs Reviewed  RESPIRATORY PANEL BY RT PCR (FLU A&B, COVID)  PREGNANCY, URINE    EKG None  Radiology No results found.  Procedures Procedures (including critical care time)  Medications Ordered in ED Medications - No data to display  ED Course  I have reviewed the triage vital signs and the nursing notes.  Pertinent labs & imaging results that were available during my care of the patient were reviewed by me and considered in my medical decision making (see chart for details).    MDM Rules/Calculators/A&P                          Patient presents with a febrile illness. She had a swab for Covid/flu. This is still pending. She is otherwise well-appearing. She has no hypoxia. No shortness of breath or chest pain. She has some mild tachycardia which is likely  relating to the underlying illness. She does not have any clinical suggestions of sepsis. Her throat exam is benign. She does not have any meningeal symptoms. She does not have other symptoms that would be more suggestive of PE. She was discharged home with symptomatic care instructions and Covid precautions. Return precautions were given. Final Clinical Impression(s) / ED Diagnoses Final diagnoses:  Influenza-like illness    Rx / DC Orders ED Discharge Orders    None       Malvin Johns, MD 07/16/20 [phone]

## 2020-07-17 ENCOUNTER — Ambulatory Visit: Payer: No Typology Code available for payment source | Admitting: Family Medicine

## 2020-07-18 ENCOUNTER — Encounter: Payer: Self-pay | Admitting: Family Medicine

## 2020-07-18 ENCOUNTER — Ambulatory Visit (INDEPENDENT_AMBULATORY_CARE_PROVIDER_SITE_OTHER): Payer: No Typology Code available for payment source | Admitting: Family Medicine

## 2020-07-18 DIAGNOSIS — J40 Bronchitis, not specified as acute or chronic: Secondary | ICD-10-CM

## 2020-07-18 DIAGNOSIS — J329 Chronic sinusitis, unspecified: Secondary | ICD-10-CM

## 2020-07-18 MED ORDER — CHERATUSSIN AC 100-10 MG/5ML PO SOLN: 5.0000 mL | ORAL | 0 refills | Status: DC | PRN

## 2020-07-18 MED ORDER — AMOXICILLIN-POT CLAVULANATE 875-125 MG PO TABS: 1.0000 | ORAL_TABLET | Freq: Two times a day (BID) | ORAL | 0 refills | Status: DC

## 2020-07-18 NOTE — Progress Notes (Signed)
    Subjective:    Patient ID: Jeanne Frederick, female    DOB: 1996/08/05, 24 y.o.   MRN: [id number]   HPI: Jeanne Frederick is a 24 y.o. female presenting for 5 days of cough with fever to 100.6.  Is productive of thick green sputum.  She has a sore throat and congestion.  She denies sinus pain and pressure.Marland Kitchen  She went to the emergency room 2 days ago and had a Covid test which was negative.  The symptoms continue to worsen since that time.  She denies shortness of breath and chest pain. She is experiencing pressure in her ears.  Depression screen [facility] 2/9 06/06/2020 04/05/2020 07/27/2019 12/29/2018 11/16/2018  Decreased Interest 0 0 0 0 0  Down, Depressed, Hopeless 0 0 0 0 0  PHQ - 2 Score 0 0 0 0 0     Relevant past medical, surgical, family and social history reviewed and updated as indicated.  Interim medical history since our last visit reviewed. Allergies and medications reviewed and updated.  ROS:  Review of Systems  Constitutional: Negative for activity change, appetite change, chills and fever.  HENT: Positive for congestion, ear pain and postnasal drip. Negative for hearing loss and sinus pressure.   Respiratory: Positive for cough. Negative for chest tightness and shortness of breath.   Gastrointestinal: Positive for vomiting (once last night after a cough paroxysm). Negative for nausea.  Skin: Negative for rash.     Social History   Tobacco Use  Smoking Status Never Smoker  Smokeless Tobacco Never Used       Objective:     Wt Readings from Last 3 Encounters:  07/16/20 120 lb (54.4 kg)  06/06/20 174 lb 2 oz (79 kg)  04/05/20 171 lb 4 oz (77.7 kg)     Exam deferred. Pt. Harboring due to COVID 19. Phone visit performed.   Assessment & Plan:  No diagnosis found.  Meds ordered this encounter  Medications  . amoxicillin-clavulanate (AUGMENTIN) 875-125 MG tablet    Sig: Take 1 tablet by mouth 2 (two) times daily. Take all of this medication    Dispense:  20  tablet    Refill:  0  . guaiFENesin-codeine (CHERATUSSIN AC) 100-10 MG/5ML syrup    Sig: Take 5 mLs by mouth every 4 (four) hours as needed for cough.    Dispense:  180 mL    Refill:  0    No orders of the defined types were placed in this encounter.     There are no diagnoses linked to this encounter.  Virtual Visit via telephone Note  I discussed the limitations, risks, security and privacy concerns of performing an evaluation and management service by telephone and the availability of in person appointments. The patient was identified with two identifiers. Pt.expressed understanding and agreed to proceed. Pt. Is at home. Dr. Livia Snellen is in his office.  Follow Up Instructions:   I discussed the assessment and treatment plan with the patient. The patient was provided an opportunity to ask questions and all were answered. The patient agreed with the plan and demonstrated an understanding of the instructions.   The patient was advised to call back or seek an in-person evaluation if the symptoms worsen or if the condition fails to improve as anticipated.   Total minutes including chart review and phone contact time: 14   Follow up plan: Return if symptoms worsen or fail to improve.  Claretta Fraise, MD [place]

## 2020-08-03 ENCOUNTER — Encounter: Payer: Self-pay | Admitting: Family Medicine

## 2020-08-03 MED ORDER — FLUCONAZOLE 150 MG PO TABS: 150.0000 mg | ORAL_TABLET | Freq: Once | ORAL | 0 refills | Status: AC

## 2020-08-06 ENCOUNTER — Encounter: Payer: Self-pay | Admitting: Family

## 2020-08-06 ENCOUNTER — Ambulatory Visit (INDEPENDENT_AMBULATORY_CARE_PROVIDER_SITE_OTHER): Payer: No Typology Code available for payment source | Admitting: Family

## 2020-08-06 ENCOUNTER — Encounter: Payer: Self-pay | Admitting: Family Medicine

## 2020-08-06 DIAGNOSIS — B9689 Other specified bacterial agents as the cause of diseases classified elsewhere: Secondary | ICD-10-CM

## 2020-08-06 DIAGNOSIS — J208 Acute bronchitis due to other specified organisms: Secondary | ICD-10-CM

## 2020-08-06 MED ORDER — PREDNISONE 10 MG (21) PO TBPK: ORAL_TABLET | ORAL | 0 refills | Status: DC

## 2020-08-06 MED ORDER — CETIRIZINE HCL 10 MG PO TABS: 10.0000 mg | ORAL_TABLET | Freq: Every day | ORAL | 11 refills | Status: DC

## 2020-08-06 MED ORDER — FLUTICASONE PROPIONATE 50 MCG/ACT NA SUSP: 2.0000 | Freq: Every day | NASAL | 6 refills | Status: DC

## 2020-08-06 MED ORDER — DOXYCYCLINE HYCLATE 100 MG PO TABS: 100.0000 mg | ORAL_TABLET | Freq: Two times a day (BID) | ORAL | 0 refills | Status: DC

## 2020-08-06 NOTE — Progress Notes (Signed)
Virtual Visit via telephone Note Due to COVID-19 pandemic this visit was conducted virtually. This visit type was conducted due to national recommendations for restrictions regarding the COVID-19 Pandemic (e.g. social distancing, sheltering in place) in an effort to limit this patient's exposure and mitigate transmission in our community. All issues noted in this document were discussed and addressed.  A physical exam was not performed with this format.  I connected with Jeanne Frederick on 08/06/20 at 3:10 pm  by telephone and verified that I am speaking with the correct person using two identifiers. Jeanne Frederick is currently located at home and no one  is currently with her during visit. The provider, Evelina Dun, FNP is located in their office at time of visit.  I discussed the limitations, risks, security and privacy concerns of performing an evaluation and management service by telephone and the availability of in person appointments. I also discussed with the patient that there may be a patient responsible charge related to this service. The patient expressed understanding and agreed to proceed.   History and Present Illness:  Pt calls the office today with recurrent cough. She reports she was sick on 07/13/20 and was negative for COVID, strep, and Flu. She reports she was feeling better with a slightly cough, but over the 4 days her symptoms have worsen.  Cough The cough is productive of purulent sputum. Associated symptoms include myalgias. Pertinent negatives include no chills, ear congestion, ear pain, fever, headaches, nasal congestion, postnasal drip, shortness of breath or wheezing. She has tried rest for the symptoms. The treatment provided mild relief.      Review of Systems  Constitutional: Negative for chills and fever.  HENT: Negative for ear pain and postnasal drip.   Respiratory: Positive for cough. Negative for shortness of breath and wheezing.   Musculoskeletal:  Positive for myalgias.  Neurological: Negative for headaches.  All other systems reviewed and are negative.    Observations/Objective: No SOB or distress noted, intermittent coarse cough  Assessment and Plan: Jeanne Frederick comes in today with chief complaint of No chief complaint on file.   Diagnosis and orders addressed:  1. Acute bacterial bronchitis Rest Force fluids Will treat since on going on and off for last 3 weeks Start zyrtec and flonase daily Prednisone and doxycyline Report if symptoms worsen or do not improve  - predniSONE (STERAPRED UNI-PAK 21 TAB) 10 MG (21) TBPK tablet; Use as directed  Dispense: 21 tablet; Refill: 0 - doxycycline (VIBRA-TABS) 100 MG tablet; Take 1 tablet (100 mg total) by mouth 2 (two) times daily.  Dispense: 20 tablet; Refill: 0 - cetirizine (ZYRTEC) 10 MG tablet; Take 1 tablet (10 mg total) by mouth daily.  Dispense: 30 tablet; Refill: 11 - fluticasone (FLONASE) 50 MCG/ACT nasal spray; Place 2 sprays into both nostrils daily.  Dispense: 16 g; Refill: 6      I discussed the assessment and treatment plan with the patient. The patient was provided an opportunity to ask questions and all were answered. The patient agreed with the plan and demonstrated an understanding of the instructions.   The patient was advised to call back or seek an in-person evaluation if the symptoms worsen or if the condition fails to improve as anticipated.  The above assessment and management plan was discussed with the patient. The patient verbalized understanding of and has agreed to the management plan. Patient is aware to call the clinic if symptoms persist or worsen. Patient is aware when to return to  the clinic for a follow-up visit. Patient educated on when it is appropriate to go to the emergency department.   Time call ended:  3:21 pm  I provided 11 minutes of non-face-to-face time during this encounter.    Evelina Dun, FNP

## 2020-08-20 ENCOUNTER — Telehealth: Payer: Self-pay | Admitting: Family Medicine

## 2020-08-20 NOTE — Telephone Encounter (Signed)
Pt states that she had a bad cold and was prescribed zyrtec, flonase and doxycycline about a week and a half ago (11/1), by [facility]. Since she began taking she has started breaking out on her face and chest. Pt is wanting to know if there is anything that she should do for this or if she should stop taking this medicine and try something else.

## 2020-08-21 NOTE — Telephone Encounter (Signed)
Have her DC the doxycycline. Avoid zyrtec, benadryl, allegra etc. As they can throw off the allergists evaluation

## 2020-08-21 NOTE — Telephone Encounter (Signed)
Pt states she started breaking out in a rash on her chest and forehead this past Sunday 08/18/20 but had been on the medications since 08/06/20. Denies it being anywhere else on her body. Pt states she hasn't taken anything to help as she is seeing an allergy doctor tomorrow and isn't supposed to take any other medications. Pt states she hasn't changed any of her beauty products, soaps or detergents. Please advise?

## 2020-08-21 NOTE — Telephone Encounter (Signed)
Pt aware of provider feedback and voiced understanding. 

## 2020-12-06 ENCOUNTER — Other Ambulatory Visit: Payer: Self-pay

## 2020-12-06 ENCOUNTER — Encounter: Payer: Self-pay | Admitting: Podiatry

## 2020-12-06 ENCOUNTER — Ambulatory Visit: Payer: No Typology Code available for payment source | Admitting: Podiatry

## 2020-12-06 ENCOUNTER — Ambulatory Visit (INDEPENDENT_AMBULATORY_CARE_PROVIDER_SITE_OTHER): Payer: No Typology Code available for payment source

## 2020-12-06 DIAGNOSIS — M722 Plantar fascial fibromatosis: Secondary | ICD-10-CM | POA: Diagnosis not present

## 2020-12-06 DIAGNOSIS — L230 Allergic contact dermatitis due to metals: Secondary | ICD-10-CM | POA: Insufficient documentation

## 2020-12-06 DIAGNOSIS — J3081 Allergic rhinitis due to animal (cat) (dog) hair and dander: Secondary | ICD-10-CM | POA: Insufficient documentation

## 2020-12-06 DIAGNOSIS — Z91018 Allergy to other foods: Secondary | ICD-10-CM | POA: Insufficient documentation

## 2020-12-06 MED ORDER — TRIAMCINOLONE ACETONIDE 40 MG/ML IJ SUSP
40.0000 mg | Freq: Once | INTRAMUSCULAR | Status: AC
  Administered 2020-12-06: 40 mg

## 2020-12-06 MED ORDER — MELOXICAM 15 MG PO TABS: 15.0000 mg | ORAL_TABLET | Freq: Every day | ORAL | 3 refills | Status: DC

## 2020-12-06 MED ORDER — METHYLPREDNISOLONE 4 MG PO TBPK: ORAL_TABLET | ORAL | 0 refills | Status: DC

## 2020-12-06 NOTE — Progress Notes (Signed)
Subjective:  Patient ID: Jeanne Frederick, female    DOB: [birth year],  MRN: [id number] HPI Chief Complaint  Patient presents with  . Foot Pain    Plantar heel/lateral side bilateral (R>L) - aching x couple months, AM pain, tried PF socks, worse after standing all day at work  . New Patient (Initial Visit)    25 y.o. female presents with the above complaint.   ROS: Denies fever chills nausea vomiting muscle aches pains calf pain back pain chest pain shortness of breath.  Past Medical History:  Diagnosis Date  . Allergy   . Family history of adverse reaction to anesthesia    tachycardia during surgery- father  . GERD (gastroesophageal reflux disease)   . Kidney infection   . Kidney stones    Past Surgical History:  Procedure Laterality Date  . CHOLECYSTECTOMY N/A 11/03/2019   Procedure: LAPAROSCOPIC CHOLECYSTECTOMY WITH INTRAOPERATIVE CHOLANGIOGRAM;  Surgeon: Manus Rudd, MD;  Location: [facility];  Service: General;  Laterality: N/A;    Current Outpatient Medications:  .  meloxicam (MOBIC) 15 MG tablet, Take 1 tablet (15 mg total) by mouth daily., Disp: 30 tablet, Rfl: 3 .  methylPREDNISolone (MEDROL DOSEPAK) 4 MG TBPK tablet, 6 day dose pack - take as directed, Disp: 21 tablet, Rfl: 0 .  colestipol (COLESTID) 1 g tablet, Take 2 g by mouth daily., Disp: , Rfl:  .  EPINEPHrine 0.15 MG/0.15ML IJ injection, Inject 0.15 mLs (0.15 mg total) into the muscle as needed for anaphylaxis. (Patient not taking: Reported on 08/06/2020), Disp: 2 each, Rfl: 3 .  norethindrone-ethinyl estradiol (JUNEL FE,GILDESS FE,LOESTRIN FE) 1-20 MG-MCG tablet, Take 1 tablet by mouth daily., Disp: , Rfl:   Allergies  Allergen Reactions  . Lac Bovis Swelling and Other (See Comments)  . Beef-Derived Products     Alpha Gal Allergy   . Other Other (See Comments)    Cannot take capsules of any kind d/t alpha gal disease  . Pork-Derived Data processing manager Gal Allergy   Review of  Systems Objective:  There were no vitals filed for this visit.  General: Well developed, nourished, in no acute distress, alert and oriented x3   Dermatological: Skin is warm, dry and supple bilateral. Nails x 10 are well maintained; remaining integument appears unremarkable at this time. There are no open sores, no preulcerative lesions, no rash or signs of infection present.  Vascular: Dorsalis Pedis artery and Posterior Tibial artery pedal pulses are 2/4 bilateral with immedate capillary fill time. Pedal hair growth present. No varicosities and no lower extremity edema present bilateral.   Neruologic: Grossly intact via light touch bilateral. Vibratory intact via tuning fork bilateral. Protective threshold with Semmes Wienstein monofilament intact to all pedal sites bilateral. Patellar and Achilles deep tendon reflexes 2+ bilateral. No Babinski or clonus noted bilateral.   Musculoskeletal: No gross boney pedal deformities bilateral. No pain, crepitus, or limitation noted with foot and ankle range of motion bilateral. Muscular strength 5/5 in all groups tested bilateral.  Gait: Unassisted, Nonantalgic.    Radiographs:  Radiographs taken today demonstrate an osseously mature individual soft tissue increase in density at the plantar fascial insertion sites left foot appears to demonstrate more of an acute problem than the right.  Assessment & Plan:   Assessment: Planter fasciitis bilateral.  Plan: Discussed etiology pathology conservative versus surgical therapies at this point we started her on a Medrol Dosepak to be followed by meloxicam.  I injected the bilateral heels with 20  mg Kenalog 5 mg Marcaine.  She tolerated procedure well without complications.  Placed on plantar fascial brace and night splint.  I would like to follow-up with her in 1 month.     Donel Osowski T. [place], [place]

## 2020-12-10 ENCOUNTER — Encounter: Payer: Self-pay | Admitting: Podiatry

## 2020-12-25 ENCOUNTER — Encounter: Payer: Self-pay | Admitting: Family Medicine

## 2021-01-08 ENCOUNTER — Ambulatory Visit: Payer: No Typology Code available for payment source | Admitting: Podiatry

## 2021-01-08 ENCOUNTER — Other Ambulatory Visit: Payer: Self-pay

## 2021-01-08 DIAGNOSIS — M722 Plantar fascial fibromatosis: Secondary | ICD-10-CM

## 2021-01-08 MED ORDER — MELOXICAM 15 MG PO TABS: 15.0000 mg | ORAL_TABLET | Freq: Every day | ORAL | 3 refills | Status: DC

## 2021-01-08 NOTE — Progress Notes (Signed)
She presents today for follow-up of her plantar fasciitis of her right foot states that is about 30% improved.  Has discontinued the plantar fascial braces because they hurt the top of her foot she is also discontinued the night splint.  And she is no longer taking her oral medication.  Objective: Vital signs are stable she alert oriented x3 still is pain on palpation Nupercaine tubercle of the right foot.  Assessment: Resolving plantar fasciitis 30%.  Plan: Offered her injection she declined.  She will start with her meloxicam once again.  We sized her up with her plantar fascial braces bilaterally she will try to continue the night splint and I will follow-up with her in 1 month if needed.

## 2021-02-07 ENCOUNTER — Ambulatory Visit: Payer: No Typology Code available for payment source | Admitting: Podiatry

## 2021-04-24 ENCOUNTER — Encounter: Payer: Self-pay | Admitting: Family Medicine

## 2021-04-24 ENCOUNTER — Other Ambulatory Visit: Payer: Self-pay

## 2021-04-24 ENCOUNTER — Ambulatory Visit: Payer: No Typology Code available for payment source | Admitting: Family Medicine

## 2021-04-24 VITALS — BP 127/81 | HR 103 | Temp 98.0°F | Resp 20 | Ht 63.0 in | Wt 173.0 lb

## 2021-04-24 DIAGNOSIS — M65341 Trigger finger, right ring finger: Secondary | ICD-10-CM

## 2021-04-24 NOTE — Patient Instructions (Signed)
Trigger Finger  Trigger finger, also called stenosing tenosynovitis,  is a condition that causes a finger to get stuck in a bent position. Each finger has a tendon, which is a tough, cord-like tissue that connects muscle tobone, and each tendon passes through a tunnel of tissue called a tendon sheath. To move your finger, your tendon needs to glide freely through the sheath. Trigger finger happens when the tendon or the sheath thickens, making it difficult to move your finger. Trigger finger can affect any finger or a thumb. It may affect more than one finger. Mild cases may clear up with rest andmedicine. Severe cases require more treatment. What are the causes? Trigger finger is caused by a thickened finger tendon or tendon sheath. Thecause of this thickening is not known. What increases the risk? The following factors may make you more likely to develop this condition: Doing activities that require a strong grip. Having rheumatoid arthritis, gout, or diabetes. Being 38-76 years old. Being female. What are the signs or symptoms? Symptoms of this condition include: Pain when bending or straightening your finger. Tenderness or swelling where your finger attaches to the palm of your hand. A lump in the palm of your hand or on the inside of your finger. Hearing a noise like a pop or a snap when you try to straighten your finger. Feeling a catching or locking sensation when you try to straighten your finger. Being unable to straighten your finger. How is this diagnosed? This condition is diagnosed based on your symptoms and a physical exam. How is this treated? This condition may be treated by: Resting your finger and avoiding activities that make symptoms worse. Wearing a finger splint to keep your finger extended. Taking NSAIDs, such as ibuprofen, to relieve pain and swelling. Doing gentle exercises to stretch the finger as told by your health care provider. Having medicine that reduces  swelling and inflammation (steroids) injected into the tendon sheath. Injections may need to be repeated. Having surgery to open the tendon sheath. This may be done if other treatments do not work and you cannot straighten your finger. You may need physical therapy after surgery. Follow these instructions at home: If you have a splint: Wear the splint as told by your health care provider. Remove it only as told by your health care provider. Loosen it if your fingers tingle, become numb, or turn cold and blue. Keep it clean. If the splint is not waterproof: Do not let it get wet. Cover it with a watertight covering when you take a bath or shower. Managing pain, stiffness, and swelling     If directed, apply heat to the affected area as often as told by your health care provider. Use the heat source that your health care provider recommends, such as a moist heat pack or a heating pad. Place a towel between your skin and the heat source. Leave the heat on for 20-30 minutes. Remove the heat if your skin turns bright red. This is especially important if you are unable to feel pain, heat, or cold. You may have a greater risk of getting burned. If directed, put ice on the painful area. To do this: If you have a removable splint, remove it as told by your health care provider. Put ice in a plastic bag. Place a towel between your skin and the bag or between your splint and the bag. Leave the ice on for 20 minutes, 2-3 times a day.  Activity Rest your finger as  told by your health care provider. Avoid activities that make the pain worse. Return to your normal activities as told by your health care provider. Ask your health care provider what activities are safe for you. Do exercises as told by your health care provider. Ask your health care provider when it is safe to drive if you have a splint on your hand. General instructions Take over-the-counter and prescription medicines only as told by  your health care provider. Keep all follow-up visits as told by your health care provider. This is important. Contact a health care provider if: Your symptoms are not improving with home care. Summary Trigger finger, also called stenosing tenosynovitis, causes your finger to get stuck in a bent position. This can make it difficult and painful to straighten your finger. This condition develops when a finger tendon or tendon sheath thickens. Treatment may include resting your finger, wearing a splint, and taking medicines. In severe cases, surgery to open the tendon sheath may be needed. This information is not intended to replace advice given to you by your health care provider. Make sure you discuss any questions you have with your healthcare provider. Document Revised: 02/07/2019 Document Reviewed: 02/07/2019 Elsevier Patient Education  [place].

## 2021-04-24 NOTE — Progress Notes (Signed)
Acute Office Visit  Subjective:    Patient ID: Jeanne Frederick, female    DOB: Jul 04, 1996, 25 y.o.   MRN: [id number]  Chief Complaint  Patient presents with   Hand Pain     HPI Patient is in today for pain of her right ring finger x 2 weeks. The pain is intermittent. It is mild. It mostly occurs when she bends or straightens her finger. The pain sometimes radiates down to the base of her ring finger. 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 Sometimes her finger gets stuck in a bent position and then will pop when she is able to straighten her finger again. She has not tried any remedies.   Past Medical History:  Diagnosis Date   Allergy    Family history of adverse reaction to anesthesia    tachycardia during surgery- father   GERD (gastroesophageal reflux disease)    Kidney infection    Kidney stones     Past Surgical History:  Procedure Laterality Date   CHOLECYSTECTOMY N/A 11/03/2019   Procedure: LAPAROSCOPIC CHOLECYSTECTOMY WITH INTRAOPERATIVE CHOLANGIOGRAM;  Surgeon: Donnie [facility], MD;  Location: [facility];  Service: General;  Laterality: N/A;    Family History  Problem Relation Age of Onset   Diabetes Maternal Grandmother    Colon cancer Neg Hx    Esophageal cancer Neg Hx    Inflammatory bowel disease Neg Hx    Liver disease Neg Hx    Pancreatic cancer Neg Hx    Rectal cancer Neg Hx    Stomach cancer Neg Hx     Social History   Socioeconomic History   Marital status: Legally Separated    Spouse name: Not on file   Number of children: Not on file   Years of education: Not on file   Highest education level: Not on file  Occupational History   Not on file  Tobacco Use   Smoking status: Never   Smokeless tobacco: Never  Vaping Use   Vaping Use: Never used  Substance and Sexual Activity   Alcohol use: No   Drug use: No   Sexual activity: Yes    Birth control/protection: Pill    Comment: married 06/2016  Other  Topics Concern   Not on file  Social History Narrative   Not on file   Social Determinants of Health   Financial Resource Strain: Not on file  Food Insecurity: Not on file  Transportation Needs: Not on file  Physical Activity: Not on file  Stress: Not on file  Social Connections: Not on file  Intimate Partner Violence: Not on file    Outpatient Medications Prior to Visit  Medication Sig Dispense Refill   colestipol (COLESTID) 1 g tablet Take 2 g by mouth daily.     EPINEPHrine 0.15 MG/0.15ML IJ injection Inject 0.15 mLs (0.15 mg total) into the muscle as needed for anaphylaxis. 2 each 3   norethindrone-ethinyl estradiol (JUNEL FE,GILDESS FE,LOESTRIN FE) 1-20 MG-MCG tablet Take 1 tablet by mouth daily.     meloxicam (MOBIC) 15 MG tablet Take 1 tablet (15 mg total) by mouth daily. (Patient not taking: Reported on 04/24/2021) 30 tablet 3   meloxicam (MOBIC) 15 MG tablet Take 1 tablet (15 mg total) by mouth daily. (Patient not taking: Reported on 04/24/2021) 30 tablet 3   methylPREDNISolone (MEDROL DOSEPAK) 4 MG TBPK tablet 6 day dose pack - take as directed 21 tablet 0   No facility-administered medications prior  to visit.    Allergies  Allergen Reactions   Lac Bovis Swelling and Other (See Comments)   Beef-Derived Products     Alpha Gal Allergy    Other Other (See Comments)    Cannot take capsules of any kind d/t alpha gal disease   Pork-Derived Products     Alpha Gal Allergy    Review of Systems Negative unless specially indicated above in HPI.     Objective:    Physical Exam Vitals and nursing note reviewed.  Constitutional:      General: She is not in acute distress.    Appearance: Normal appearance. She is not ill-appearing or diaphoretic.  Cardiovascular:     Rate and Rhythm: Normal rate and regular rhythm.     Heart sounds: Normal heart sounds. No murmur heard. Pulmonary:     Effort: Pulmonary effort is normal. No respiratory distress.     Breath sounds:  Normal breath sounds.  Musculoskeletal:     Right hand: Tenderness (ring finger) present. No swelling, deformity or bony tenderness. Normal range of motion.  Neurological:     General: No focal deficit present.     Mental Status: She is alert.  Psychiatric:        Mood and Affect: Mood normal.        Behavior: Behavior normal.    BP 127/81   Pulse (!) 103   Temp 98 F (36.7 C) (Temporal)   Resp 20   Ht 5\' 3"  (1.6 m)   Wt 173 lb (78.5 kg)   SpO2 98%   BMI 30.65 kg/m  Wt Readings from Last 3 Encounters:  04/24/21 173 lb (78.5 kg)  07/16/20 120 lb (54.4 kg)  06/06/20 174 lb 2 oz (79 kg)    Health Maintenance Due  Topic Date Due   COVID-19 Vaccine (1) Never done   HPV VACCINES (1 - 2-dose series) Never done   Hepatitis C Screening  Never done   TETANUS/TDAP  Never done   PAP-Cervical Cytology Screening  11/13/2019   PAP SMEAR-Modifier  11/13/2019       Topic Date Due   HPV VACCINES (1 - 2-dose series) Never done     Lab Results  Component Value Date   TSH 0.94 03/30/2019   Lab Results  Component Value Date   WBC 7.8 06/12/2020   HGB 13.4 06/12/2020   HCT 40.6 06/12/2020   MCV 95 06/12/2020   PLT 270 06/12/2020   Lab Results  Component Value Date   NA 141 06/12/2020   K 4.2 06/12/2020   CO2 21 06/12/2020   GLUCOSE 87 06/12/2020   BUN 8 06/12/2020   CREATININE 0.64 06/12/2020   BILITOT 0.3 06/12/2020   ALKPHOS 44 (L) 06/12/2020   AST 16 06/12/2020   ALT 9 06/12/2020   PROT 6.6 06/12/2020   ALBUMIN 4.1 06/12/2020   CALCIUM 8.9 06/12/2020   ANIONGAP 8 02/23/2018   GFR 130.65 03/30/2019   Lab Results  Component Value Date   CHOL 166 06/12/2020   Lab Results  Component Value Date   HDL 50 06/12/2020   Lab Results  Component Value Date   LDLCALC 94 06/12/2020   Lab Results  Component Value Date   TRIG 121 06/12/2020   Lab Results  Component Value Date   CHOLHDL 3.3 06/12/2020   No results found for: HGBA1C     Assessment & Plan:    [name] was seen today for hand pain.  Diagnoses and all orders  for this visit:  Trigger ring finger of right hand Discussed splint or buddy taping, NSAIDs, and rest. Referral placed for ortho for possible injection if symptoms do not improve or worsen.  -     Ambulatory referral to Orthopedic Surgery  Return to office for new or worsening symptoms, or if symptoms persist.   The patient indicates understanding of these issues and agrees with the plan.   Gwenlyn Perking, FNP

## 2021-05-01 ENCOUNTER — Ambulatory Visit: Payer: No Typology Code available for payment source | Admitting: Family Medicine

## 2021-05-03 ENCOUNTER — Encounter: Payer: Self-pay | Admitting: Nurse Practitioner

## 2021-05-03 ENCOUNTER — Other Ambulatory Visit: Payer: Self-pay

## 2021-05-03 ENCOUNTER — Ambulatory Visit: Payer: No Typology Code available for payment source | Admitting: Nurse Practitioner

## 2021-05-03 VITALS — BP 114/67 | HR 93 | Temp 97.9°F | Resp 20 | Ht 63.0 in | Wt 175.0 lb

## 2021-05-03 DIAGNOSIS — M65341 Trigger finger, right ring finger: Secondary | ICD-10-CM

## 2021-05-03 MED ORDER — METHYLPREDNISOLONE ACETATE 40 MG/ML IJ SUSP
10.0000 mg | Freq: Once | INTRAMUSCULAR | Status: AC
  Administered 2021-05-03: 10 mg via INTRA_ARTICULAR

## 2021-05-03 MED ORDER — LIDOCAINE HCL 2 % IJ SOLN
0.7500 mL | Freq: Once | INTRAMUSCULAR | Status: AC
  Administered 2021-05-03: 15 mg via INTRADERMAL

## 2021-05-03 NOTE — Progress Notes (Signed)
   Subjective:    Patient ID: Jeanne Frederick, female    DOB: 1996-02-15, 25 y.o.   MRN: [id number]   Chief Complaint: Trigger finger (Right hand ring finger/)   HPI Patient come sin with trigger finger. Right ring finger gets stuck when she makes a fist. Seems to lock more at night then throughout the day.Wants an injection to see if helps.    Review of Systems  All other systems reviewed and are negative.     Objective:   Physical Exam Vitals and nursing note reviewed.  Constitutional:      Appearance: Normal appearance.  Cardiovascular:     Rate and Rhythm: Normal rate and regular rhythm.     Heart sounds: Normal heart sounds.  Pulmonary:     Effort: Pulmonary effort is normal.     Breath sounds: Normal breath sounds.  Neurological:     General: No focal deficit present.     Mental Status: She is alert and oriented to person, place, and time.    BP 114/67   Pulse 93   Temp 97.9 F (36.6 C) (Temporal)   Resp 20   Ht '5\' 3"'$  (1.6 m)   Wt 175 lb (79.4 kg)   SpO2 99%   BMI 31.00 kg/m   Joint Injection/Arthrocentesis  Date/Time: 05/03/2021 3:31 PM Performed by: Chevis Pretty, FNP Authorized by: Hassell Done Mary-Margaret, FNP  Indications: pain  Body area: finger Location details: right ring finger Local anesthesia used: no  Anesthesia: Local anesthesia used: no  Sedation: Patient sedated: no  Preparation: Patient was prepped and draped in the usual sterile fashion. Needle gauge: 27g. Ultrasound guidance: no Betamethasone amount: 10 mg Lidocaine 2% amount: 0.75 mL Patient tolerance: patient tolerated the procedure well with no immediate complications        Assessment & Plan:   Greenlee Moro in today with chief complaint of Trigger finger (Right hand ring finger/)   1. Trigger finger, right ring finger Ice tonight Rest Wear spint when sleeping RTO prn    The above assessment and management plan was discussed with the patient. The  patient verbalized understanding of and has agreed to the management plan. Patient is aware to call the clinic if symptoms persist or worsen. Patient is aware when to return to the clinic for a follow-up visit. Patient educated on when it is appropriate to go to the emergency department.   Mary-Margaret Hassell Done, FNP

## 2021-05-03 NOTE — Patient Instructions (Signed)
Trigger Finger  Trigger finger, also called stenosing tenosynovitis,  is a condition that causes a finger to get stuck in a bent position. Each finger has a tendon, which is a tough, cord-like tissue that connects muscle tobone, and each tendon passes through a tunnel of tissue called a tendon sheath. To move your finger, your tendon needs to glide freely through the sheath. Trigger finger happens when the tendon or the sheath thickens, making it difficult to move your finger. Trigger finger can affect any finger or a thumb. It may affect more than one finger. Mild cases may clear up with rest andmedicine. Severe cases require more treatment. What are the causes? Trigger finger is caused by a thickened finger tendon or tendon sheath. Thecause of this thickening is not known. What increases the risk? The following factors may make you more likely to develop this condition: Doing activities that require a strong grip. Having rheumatoid arthritis, gout, or diabetes. Being 16-38 years old. Being female. What are the signs or symptoms? Symptoms of this condition include: Pain when bending or straightening your finger. Tenderness or swelling where your finger attaches to the palm of your hand. A lump in the palm of your hand or on the inside of your finger. Hearing a noise like a pop or a snap when you try to straighten your finger. Feeling a catching or locking sensation when you try to straighten your finger. Being unable to straighten your finger. How is this diagnosed? This condition is diagnosed based on your symptoms and a physical exam. How is this treated? This condition may be treated by: Resting your finger and avoiding activities that make symptoms worse. Wearing a finger splint to keep your finger extended. Taking NSAIDs, such as ibuprofen, to relieve pain and swelling. Doing gentle exercises to stretch the finger as told by your health care provider. Having medicine that reduces  swelling and inflammation (steroids) injected into the tendon sheath. Injections may need to be repeated. Having surgery to open the tendon sheath. This may be done if other treatments do not work and you cannot straighten your finger. You may need physical therapy after surgery. Follow these instructions at home: If you have a splint: Wear the splint as told by your health care provider. Remove it only as told by your health care provider. Loosen it if your fingers tingle, become numb, or turn cold and blue. Keep it clean. If the splint is not waterproof: Do not let it get wet. Cover it with a watertight covering when you take a bath or shower. Managing pain, stiffness, and swelling     If directed, apply heat to the affected area as often as told by your health care provider. Use the heat source that your health care provider recommends, such as a moist heat pack or a heating pad. Place a towel between your skin and the heat source. Leave the heat on for 20-30 minutes. Remove the heat if your skin turns bright red. This is especially important if you are unable to feel pain, heat, or cold. You may have a greater risk of getting burned. If directed, put ice on the painful area. To do this: If you have a removable splint, remove it as told by your health care provider. Put ice in a plastic bag. Place a towel between your skin and the bag or between your splint and the bag. Leave the ice on for 20 minutes, 2-3 times a day.  Activity Rest your finger as  told by your health care provider. Avoid activities that make the pain worse. Return to your normal activities as told by your health care provider. Ask your health care provider what activities are safe for you. Do exercises as told by your health care provider. Ask your health care provider when it is safe to drive if you have a splint on your hand. General instructions Take over-the-counter and prescription medicines only as told by  your health care provider. Keep all follow-up visits as told by your health care provider. This is important. Contact a health care provider if: Your symptoms are not improving with home care. Summary Trigger finger, also called stenosing tenosynovitis, causes your finger to get stuck in a bent position. This can make it difficult and painful to straighten your finger. This condition develops when a finger tendon or tendon sheath thickens. Treatment may include resting your finger, wearing a splint, and taking medicines. In severe cases, surgery to open the tendon sheath may be needed. This information is not intended to replace advice given to you by your health care provider. Make sure you discuss any questions you have with your healthcare provider. Document Revised: 02/07/2019 Document Reviewed: 02/07/2019 Elsevier Patient Education  [place].

## 2021-05-07 ENCOUNTER — Encounter: Payer: No Typology Code available for payment source | Admitting: Family Medicine

## 2021-05-15 ENCOUNTER — Ambulatory Visit (INDEPENDENT_AMBULATORY_CARE_PROVIDER_SITE_OTHER): Payer: No Typology Code available for payment source | Admitting: Family Medicine

## 2021-05-15 ENCOUNTER — Other Ambulatory Visit: Payer: Self-pay

## 2021-05-15 ENCOUNTER — Encounter: Payer: Self-pay | Admitting: Family Medicine

## 2021-05-15 VITALS — BP 125/81 | HR 101 | Temp 98.4°F | Ht 63.0 in | Wt 173.8 lb

## 2021-05-15 DIAGNOSIS — Z0000 Encounter for general adult medical examination without abnormal findings: Secondary | ICD-10-CM

## 2021-05-15 NOTE — Progress Notes (Signed)
Subjective:  Patient ID: Jeanne Frederick, female    DOB: [birth year]  Age: 25 y.o. MRN: [id number]  CC: Annual Exam   HPI [place] presents for Annual exam witout GYN. Will be done next week with GYN.   Plantar fascitis under care of podiatry. USes colestipol to prevent diarrhea since cholecystectomy.   Depression screen [facility] 2/9 05/15/2021 04/24/2021 06/06/2020  Decreased Interest 0 0 0  Down, Depressed, Hopeless 0 0 0  PHQ - 2 Score 0 0 0  Altered sleeping - 2 -  Tired, decreased energy - 2 -  Change in appetite - 0 -  Feeling bad or failure about yourself  - 0 -  Trouble concentrating - 0 -  Moving slowly or fidgety/restless - 0 -  Suicidal thoughts - 0 -  PHQ-9 Score - 4 -  Difficult doing work/chores - Not difficult at all -    History Jeanne Frederick has a past medical history of Allergy, Family history of adverse reaction to anesthesia, GERD (gastroesophageal reflux disease), Kidney infection, and Kidney stones.   She has a past surgical history that includes Cholecystectomy (N/A, 11/03/2019).   Her family history includes Diabetes in her maternal grandmother.She reports that she has never smoked. She has never used smokeless tobacco. She reports that she does not drink alcohol and does not use drugs.    ROS Review of Systems  Constitutional: Negative.  Negative for appetite change, chills, diaphoresis, fatigue, fever and unexpected weight change.  HENT:  Negative for congestion, ear pain, hearing loss, postnasal drip, rhinorrhea, sneezing, sore throat and trouble swallowing.   Eyes:  Negative for pain and visual disturbance.  Respiratory:  Negative for cough, chest tightness and shortness of breath.   Cardiovascular:  Negative for chest pain and palpitations.  Gastrointestinal:  Negative for abdominal pain, constipation, diarrhea, nausea and vomiting.  Endocrine: Negative for cold intolerance, heat intolerance, polydipsia, polyphagia and polyuria.  Genitourinary:   Negative for difficulty urinating, dysuria, frequency and menstrual problem.  Musculoskeletal:  Negative for arthralgias, joint swelling and myalgias.  Skin:  Negative for rash.  Allergic/Immunologic: Negative for environmental allergies.  Neurological:  Negative for dizziness, weakness, numbness and headaches.  Psychiatric/Behavioral:  Negative for agitation, dysphoric mood and sleep disturbance.    Objective:  BP 125/81   Pulse (!) 101   Temp 98.4 F (36.9 C)   Ht 5' 3" (1.6 m)   Wt 173 lb 12.8 oz (78.8 kg)   SpO2 98%   BMI 30.79 kg/m   BP Readings from Last 3 Encounters:  05/15/21 125/81  05/03/21 114/67  04/24/21 127/81    Wt Readings from Last 3 Encounters:  05/15/21 173 lb 12.8 oz (78.8 kg)  05/03/21 175 lb (79.4 kg)  04/24/21 173 lb (78.5 kg)     Physical Exam Vitals reviewed.  Constitutional:      General: She is not in acute distress.    Appearance: She is well-developed. She is not diaphoretic.  HENT:     Head: Normocephalic and atraumatic.     Right Ear: External ear normal.     Left Ear: External ear normal.     Nose: Nose normal.     Mouth/Throat:     Pharynx: No oropharyngeal exudate.  Eyes:     General: No scleral icterus.       Right eye: No discharge.        Left eye: No discharge.     Conjunctiva/sclera: Conjunctivae normal.     Pupils: Pupils  are equal, round, and reactive to light.  Neck:     Thyroid: No thyromegaly.     Vascular: No JVD.  Cardiovascular:     Rate and Rhythm: Normal rate and regular rhythm.     Heart sounds: Normal heart sounds. No murmur heard.   No friction rub. No gallop.  Pulmonary:     Effort: Pulmonary effort is normal. No respiratory distress.     Breath sounds: Normal breath sounds. No stridor. No wheezing or rales.  Chest:     Chest wall: No tenderness.  Abdominal:     General: Bowel sounds are normal.     Palpations: Abdomen is soft.     Tenderness: There is no abdominal tenderness.  Musculoskeletal:         General: Normal range of motion.     Cervical back: Normal range of motion and neck supple.  Lymphadenopathy:     Cervical: No cervical adenopathy.  Skin:    General: Skin is warm and dry.  Neurological:     Mental Status: She is alert and oriented to person, place, and time.     Cranial Nerves: No cranial nerve deficit.     Motor: No abnormal muscle tone.     Coordination: Coordination normal.     Deep Tendon Reflexes: Reflexes normal.  Psychiatric:        Behavior: Behavior normal.      Assessment & Plan:   Jeanne Frederick was seen today for annual exam.  Diagnoses and all orders for this visit:  Well adult exam -     CBC with Differential/Platelet -     CMP14+EGFR      I have discontinued Jeanne Frederick Randazzo's norethindrone-ethinyl estradiol-FE and cetirizine. I am also having her maintain her EPINEPHrine, colestipol, and Junel 1.5/30.  Allergies as of 05/15/2021       Reactions   Lac Bovis Swelling, Other (See Comments)   Beef-derived Products    Alpha Gal Allergy   Other Other (See Comments)   Cannot take capsules of any kind d/t alpha gal disease   Pork-derived Products    Alpha Gal Allergy        Medication List        Accurate as of May 15, 2021 11:59 PM. If you have any questions, ask your nurse or doctor.          STOP taking these medications    cetirizine 10 MG tablet Commonly known as: ZYRTEC Stopped by: Claretta Fraise, MD   norethindrone-ethinyl estradiol-FE 1-20 MG-MCG tablet Commonly known as: LOESTRIN FE Stopped by: Claretta Fraise, MD       TAKE these medications    colestipol 1 g tablet Commonly known as: COLESTID Take 2 g by mouth daily.   EPINEPHrine 0.15 MG/0.15ML injection Commonly known as: ADRENACLICK Inject 1.49 mLs (0.15 mg total) into the muscle as needed for anaphylaxis.   Junel 1.5/30 1.5-30 MG-MCG tablet Generic drug: Norethindrone Acetate-Ethinyl Estradiol Take 1 tablet by mouth daily.         Follow-up:  No follow-ups on file.  Claretta Fraise, M.D.

## 2021-05-21 LAB — CBC WITH DIFFERENTIAL/PLATELET
Basophils Absolute: 0.1 10*3/uL (ref 0.0–0.2)
Basos: 1 %
EOS (ABSOLUTE): 0.1 10*3/uL (ref 0.0–0.4)
Eos: 1 %
Hematocrit: 39.3 % (ref 34.0–46.6)
Hemoglobin: 13.1 g/dL (ref 11.1–15.9)
Immature Grans (Abs): 0 10*3/uL (ref 0.0–0.1)
Immature Granulocytes: 0 %
Lymphocytes Absolute: 3.6 10*3/uL — ABNORMAL HIGH (ref 0.7–3.1)
Lymphs: 35 %
MCH: 30.6 pg (ref 26.6–33.0)
MCHC: 33.3 g/dL (ref 31.5–35.7)
MCV: 92 fL (ref 79–97)
Monocytes Absolute: 0.6 10*3/uL (ref 0.1–0.9)
Monocytes: 6 %
Neutrophils Absolute: 6 10*3/uL (ref 1.4–7.0)
Neutrophils: 57 %
Platelets: 324 10*3/uL (ref 150–450)
RBC: 4.28 x10E6/uL (ref 3.77–5.28)
RDW: 11.9 % (ref 11.7–15.4)
WBC: 10.3 10*3/uL (ref 3.4–10.8)

## 2021-05-21 LAB — CMP14+EGFR
ALT: 11 IU/L (ref 0–32)
AST: 15 IU/L (ref 0–40)
Albumin/Globulin Ratio: 2 (ref 1.2–2.2)
Albumin: 4.5 g/dL (ref 3.9–5.0)
Alkaline Phosphatase: 49 IU/L (ref 44–121)
BUN/Creatinine Ratio: 13 (ref 9–23)
BUN: 9 mg/dL (ref 6–20)
Bilirubin Total: <0.2 mg/dL (ref 0.0–1.2)
CO2: 21 mmol/L (ref 20–29)
Calcium: 9.6 mg/dL (ref 8.7–10.2)
Chloride: 103 mmol/L (ref 96–106)
Creatinine, Ser: 0.71 mg/dL (ref 0.57–1.00)
Globulin, Total: 2.3 g/dL (ref 1.5–4.5)
Glucose: 91 mg/dL (ref 65–99)
Potassium: 4.3 mmol/L (ref 3.5–5.2)
Sodium: 139 mmol/L (ref 134–144)
Total Protein: 6.8 g/dL (ref 6.0–8.5)
eGFR: 121 mL/min/{1.73_m2} (ref >59)

## 2021-05-24 NOTE — Progress Notes (Signed)
Hello [name],  Your lab result is normal and/or stable.Some minor variations that are not significant are commonly marked abnormal, but do not represent any medical problem for you.  Best regards, Claretta Fraise, M.D.

## 2021-07-18 ENCOUNTER — Ambulatory Visit: Payer: No Typology Code available for payment source | Admitting: Family Medicine

## 2021-07-18 ENCOUNTER — Encounter: Payer: Self-pay | Admitting: Family Medicine

## 2021-07-18 DIAGNOSIS — J069 Acute upper respiratory infection, unspecified: Secondary | ICD-10-CM | POA: Diagnosis not present

## 2021-07-18 LAB — VERITOR FLU A/B WAIVED
Influenza A: NEGATIVE
Influenza B: NEGATIVE

## 2021-07-18 NOTE — Progress Notes (Signed)
Subjective:    Patient ID: Jeanne Frederick, female    DOB: 1995-11-24, 25 y.o.   MRN: [id number]   HPI: Jeanne Frederick is a 25 y.o. female presenting for fever, chills sweats. Dry cough. Body aches - very much. Onset yesterday. Sore throat. No ear aches. Appetite okay.   Depression screen [facility] 2/9 05/15/2021 04/24/2021 06/06/2020 04/05/2020 07/27/2019  Decreased Interest 0 0 0 0 0  Down, Depressed, Hopeless 0 0 0 0 0  PHQ - 2 Score 0 0 0 0 0  Altered sleeping - 2 - - -  Tired, decreased energy - 2 - - -  Change in appetite - 0 - - -  Feeling bad or failure about yourself  - 0 - - -  Trouble concentrating - 0 - - -  Moving slowly or fidgety/restless - 0 - - -  Suicidal thoughts - 0 - - -  PHQ-9 Score - 4 - - -  Difficult doing work/chores - Not difficult at all - - -     Relevant past medical, surgical, family and social history reviewed and updated as indicated.  Interim medical history since our last visit reviewed. Allergies and medications reviewed and updated.  ROS:  Review of Systems  Constitutional:  Negative for activity change, appetite change, chills and fever.  HENT:  Positive for congestion, postnasal drip, rhinorrhea and sinus pressure. Negative for ear discharge, ear pain, hearing loss, nosebleeds, sneezing and trouble swallowing.   Respiratory:  Negative for chest tightness and shortness of breath.   Cardiovascular:  Negative for chest pain and palpitations.  Skin:  Negative for rash.    Social History   Tobacco Use  Smoking Status Never  Smokeless Tobacco Never       Objective:     Wt Readings from Last 3 Encounters:  05/15/21 173 lb 12.8 oz (78.8 kg)  05/03/21 175 lb (79.4 kg)  04/24/21 173 lb (78.5 kg)     Exam deferred. Pt. Harboring due to COVID 19. Phone visit performed.   Assessment & Plan:   1. Viral URI      Orders Placed This Encounter  Procedures   Novel Coronavirus, NAA ([facility])    Order Specific Question:   Previously tested  for COVID-19    Answer:   Yes    Order Specific Question:   Resident in a congregate (group) care setting    Answer:   No    Order Specific Question:   Is the patient student?    Answer:   Yes    Order Specific Question:   Employed in healthcare setting    Answer:   No    Order Specific Question:   Pregnant    Answer:   No    Order Specific Question:   Has patient completed COVID vaccination(s) (2 doses of Pfizer/Moderna 1 dose of [place])    Answer:   No    Order Specific Question:   Release to patient    Answer:   Immediate   Veritor Flu A/B Waived    Order Specific Question:   Source    Answer:   nasal    Order Specific Question:   Release to patient    Answer:   Immediate      Diagnoses and all orders for this visit:  Viral URI -     Novel Coronavirus, NAA ([facility]) -     Veritor Flu A/B Waived   Virtual Visit via telephone Note  I discussed the limitations, risks, security and privacy concerns of performing an evaluation and management service by telephone and the availability of in person appointments. The patient was identified with two identifiers. Pt.expressed understanding and agreed to proceed. Pt. Is at home. Dr. Livia Snellen is in his office.  Follow Up Instructions:   I discussed the assessment and treatment plan with the patient. The patient was provided an opportunity to ask questions and all were answered. The patient agreed with the plan and demonstrated an understanding of the instructions.   The patient was advised to call back or seek an in-person evaluation if the symptoms worsen or if the condition fails to improve as anticipated.   Total minutes including chart review and phone contact time: 7   Follow up plan: Return if symptoms worsen or fail to improve.  Claretta Fraise, MD [place]

## 2021-07-19 ENCOUNTER — Other Ambulatory Visit: Payer: Self-pay | Admitting: Family Medicine

## 2021-07-19 LAB — NOVEL CORONAVIRUS, NAA: SARS-CoV-2, NAA: NOT DETECTED

## 2021-07-19 LAB — SARS-COV-2, NAA 2 DAY TAT

## 2021-07-19 MED ORDER — SULFAMETHOXAZOLE-TRIMETHOPRIM 800-160 MG PO TABS: 1.0000 | ORAL_TABLET | Freq: Two times a day (BID) | ORAL | 0 refills | Status: DC

## 2021-07-19 MED ORDER — ONDANSETRON 8 MG PO TBDP: 8.0000 mg | ORAL_TABLET | Freq: Four times a day (QID) | ORAL | 1 refills | Status: DC | PRN

## 2021-07-19 NOTE — Progress Notes (Signed)
Hello [name],  Your lab result is normal and/or stable.Some minor variations that are not significant are commonly marked abnormal, but do not represent any medical problem for you.  Best regards, Claretta Fraise, M.D.

## 2021-08-07 ENCOUNTER — Encounter: Payer: Self-pay | Admitting: Family Medicine

## 2021-08-28 ENCOUNTER — Ambulatory Visit (INDEPENDENT_AMBULATORY_CARE_PROVIDER_SITE_OTHER): Payer: No Typology Code available for payment source | Admitting: Family Medicine

## 2021-08-28 ENCOUNTER — Encounter: Payer: Self-pay | Admitting: Family Medicine

## 2021-08-28 DIAGNOSIS — Z20828 Contact with and (suspected) exposure to other viral communicable diseases: Secondary | ICD-10-CM

## 2021-08-28 DIAGNOSIS — R509 Fever, unspecified: Secondary | ICD-10-CM

## 2021-08-28 DIAGNOSIS — R6889 Other general symptoms and signs: Secondary | ICD-10-CM | POA: Diagnosis not present

## 2021-08-28 MED ORDER — OSELTAMIVIR PHOSPHATE 75 MG PO CAPS
75.0000 mg | ORAL_CAPSULE | Freq: Two times a day (BID) | ORAL | 0 refills | Status: AC
Start: 2021-08-28 — End: 2021-09-02

## 2021-08-28 NOTE — Progress Notes (Signed)
Virtual Visit via telephone Note Due to COVID-19 pandemic this visit was conducted virtually. This visit type was conducted due to national recommendations for restrictions regarding the COVID-19 Pandemic (e.g. social distancing, sheltering in place) in an effort to limit this patient's exposure and mitigate transmission in our community. All issues noted in this document were discussed and addressed.  A physical exam was not performed with this format.   I connected with Jeanne Frederick on 08/28/2021 at 0800 by telephone and verified that I am speaking with the correct person using two identifiers. Jeanne Frederick is currently located at home and family is currently with them during visit. The provider, Monia Pouch, FNP is located in their office at time of visit.  I discussed the limitations, risks, security and privacy concerns of performing an evaluation and management service by telephone and the availability of in person appointments. I also discussed with the patient that there may be a patient responsible charge related to this service. The patient expressed understanding and agreed to proceed.  Subjective:  Patient ID: Jeanne Frederick, female    DOB: Mar 02, 1996, 25 y.o.   MRN: [id number]  Chief Complaint:  Fever and Cough   HPI: [name] is a 25 y.o. female presenting on 08/28/2021 for Fever and Cough   Patient reports cough, fever, chills, headache, myalgias, and congestion for 2 days.  States everyone at her place of employment has tested positive for influenza.  Patient has been taking Tylenol and Motrin for fever and pain control.  She has not been taking anything for the cough and congestion.   Relevant past medical, surgical, family, and social history reviewed and updated as indicated.  Allergies and medications reviewed and updated.   Past Medical History:  Diagnosis Date   Allergy    Family history of adverse reaction to anesthesia    tachycardia during  surgery- father   GERD (gastroesophageal reflux disease)    Kidney infection    Kidney stones     Past Surgical History:  Procedure Laterality Date   CHOLECYSTECTOMY N/A 11/03/2019   Procedure: LAPAROSCOPIC CHOLECYSTECTOMY WITH INTRAOPERATIVE CHOLANGIOGRAM;  Surgeon: Donnie Mesa, MD;  Location: [place];  Service: General;  Laterality: N/A;    Social History   Socioeconomic History   Marital status: Legally Separated    Spouse name: Not on file   Number of children: Not on file   Years of education: Not on file   Highest education level: Not on file  Occupational History   Not on file  Tobacco Use   Smoking status: Never   Smokeless tobacco: Never  Vaping Use   Vaping Use: Never used  Substance and Sexual Activity   Alcohol use: No   Drug use: No   Sexual activity: Yes    Birth control/protection: Pill    Comment: married 06/2016  Other Topics Concern   Not on file  Social History Narrative   Not on file   Social Determinants of Health   Financial Resource Strain: Not on file  Food Insecurity: Not on file  Transportation Needs: Not on file  Physical Activity: Not on file  Stress: Not on file  Social Connections: Not on file  Intimate Partner Violence: Not on file    Outpatient Encounter Medications as of 08/28/2021  Medication Sig   oseltamivir (TAMIFLU) 75 MG capsule Take 1 capsule (75 mg total) by mouth 2 (two) times daily for 5 days.   colestipol (COLESTID) 1 g tablet Take 2  g by mouth daily.   EPINEPHrine 0.15 MG/0.15ML IJ injection Inject 0.15 mLs (0.15 mg total) into the muscle as needed for anaphylaxis.   JUNEL 1.5/30 1.5-30 MG-MCG tablet Take 1 tablet by mouth daily.   ondansetron (ZOFRAN-ODT) 8 MG disintegrating tablet Take 1 tablet (8 mg total) by mouth every 6 (six) hours as needed for nausea or vomiting.   sulfamethoxazole-trimethoprim (BACTRIM DS) 800-160 MG tablet Take 1 tablet by mouth 2 (two) times daily. Until gone, for  infection   No facility-administered encounter medications on file as of 08/28/2021.    Allergies  Allergen Reactions   Lac Bovis Swelling and Other (See Comments)   Beef-Derived Products     Alpha Gal Allergy    Other Other (See Comments)    Cannot take capsules of any kind d/t alpha gal disease   Pork-Derived Products     Alpha Gal Allergy    Review of Systems  Constitutional:  Positive for activity change, appetite change, chills, fatigue and fever. Negative for diaphoresis and unexpected weight change.  HENT:  Positive for congestion, postnasal drip, rhinorrhea and sore throat.   Respiratory:  Positive for cough. Negative for shortness of breath.   Cardiovascular:  Negative for chest pain, palpitations and leg swelling.  Gastrointestinal:  Negative for abdominal pain.  Genitourinary:  Negative for decreased urine volume and difficulty urinating.  Musculoskeletal:  Positive for myalgias.  Neurological:  Positive for headaches. Negative for weakness.  Psychiatric/Behavioral:  Negative for confusion.   All other systems reviewed and are negative.       Observations/Objective: No vital signs or physical exam, this was a telephone or virtual health encounter.  Pt alert and oriented, answers all questions appropriately, and able to speak in full sentences.    Assessment and Plan: [name] was seen today for fever and cough.  Diagnoses and all orders for this visit:  Fever and chills Flu-like symptoms Exposure to influenza Reported symptoms consistent with influenza.  No known positive exposure.  Will initiate Tamiflu as prescribed.  Continue symptomatic care at home.  Patient aware to report any new or worsening symptoms.  Follow-up as needed. -     oseltamivir (TAMIFLU) 75 MG capsule; Take 1 capsule (75 mg total) by mouth 2 (two) times daily for 5 days.     Follow Up Instructions: Return if symptoms worsen or fail to improve.    I discussed the assessment and  treatment plan with the patient. The patient was provided an opportunity to ask questions and all were answered. The patient agreed with the plan and demonstrated an understanding of the instructions.   The patient was advised to call back or seek an in-person evaluation if the symptoms worsen or if the condition fails to improve as anticipated.  The above assessment and management plan was discussed with the patient. The patient verbalized understanding of and has agreed to the management plan. Patient is aware to call the clinic if they develop any new symptoms or if symptoms persist or worsen. Patient is aware when to return to the clinic for a follow-up visit. Patient educated on when it is appropriate to go to the emergency department.    I provided 11 minutes of non-face-to-face time during this encounter. The call started at 0800. The call ended at 0810. The other time was used for coordination of care.    Monia Pouch, FNP-C [facility] [address] [place], [place] [address] [phone] 08/28/2021

## 2021-12-23 ENCOUNTER — Other Ambulatory Visit: Payer: Self-pay

## 2022-08-11 ENCOUNTER — Telehealth: Payer: Self-pay | Admitting: Family Medicine

## 2022-08-12 NOTE — Telephone Encounter (Signed)
Called patient, no answer 

## 2022-08-18 NOTE — Telephone Encounter (Signed)
APPOINTMENT SCHEDULED

## 2022-08-21 ENCOUNTER — Ambulatory Visit (INDEPENDENT_AMBULATORY_CARE_PROVIDER_SITE_OTHER): Payer: No Typology Code available for payment source | Admitting: Family Medicine

## 2022-08-21 ENCOUNTER — Encounter: Payer: Self-pay | Admitting: Family Medicine

## 2022-08-21 VITALS — BP 118/71 | HR 109 | Temp 97.9°F | Ht 63.0 in | Wt 184.6 lb

## 2022-08-21 DIAGNOSIS — Z0001 Encounter for general adult medical examination with abnormal findings: Secondary | ICD-10-CM | POA: Diagnosis not present

## 2022-08-21 DIAGNOSIS — Z23 Encounter for immunization: Secondary | ICD-10-CM

## 2022-08-21 DIAGNOSIS — Z87892 Personal history of anaphylaxis: Secondary | ICD-10-CM

## 2022-08-24 ENCOUNTER — Encounter: Payer: Self-pay | Admitting: Family Medicine

## 2022-08-24 MED ORDER — EPINEPHRINE 0.3 MG/0.3ML IJ SOAJ: 0.3000 mg | INTRAMUSCULAR | 5 refills | Status: AC | PRN

## 2022-08-24 NOTE — Progress Notes (Signed)
Annual exam  Subjective:  Patient ID: Jeanne Frederick, female    DOB: [birth year]  Age: 26 y.o. MRN: [id number]  CC: Employment Physical   HPI Jeanne Frederick presents for annual exam     08/21/2022    3:34 PM 05/15/2021    2:53 PM 04/24/2021    3:56 PM  Depression screen PHQ 2/9  Decreased Interest 0 0 0  Down, Depressed, Hopeless 0 0 0  PHQ - 2 Score 0 0 0  Altered sleeping   2  Tired, decreased energy   2  Change in appetite   0  Feeling bad or failure about yourself    0  Trouble concentrating   0  Moving slowly or fidgety/restless   0  Suicidal thoughts   0  PHQ-9 Score   4  Difficult doing work/chores   Not difficult at all    History Jeanne Frederick has a past medical history of Allergy, Family history of adverse reaction to anesthesia, GERD (gastroesophageal reflux disease), Kidney infection, and Kidney stones.   She has a past surgical history that includes Cholecystectomy (N/A, 11/03/2019) and Tonsillectomy.   Her family history includes Diabetes in her maternal grandmother.She reports that she has never smoked. She has never used smokeless tobacco. She reports that she does not drink alcohol and does not use drugs.    ROS Review of Systems  Constitutional: Negative.   HENT: Negative.  Negative for congestion.   Eyes:  Negative for visual disturbance.  Respiratory:  Negative for shortness of breath.   Cardiovascular:  Negative for chest pain.  Gastrointestinal:  Negative for abdominal pain, constipation, diarrhea, nausea and vomiting.  Genitourinary:  Negative for difficulty urinating.  Musculoskeletal:  Negative for arthralgias and myalgias.  Neurological:  Negative for headaches.  Psychiatric/Behavioral:  Negative for sleep disturbance.     Objective:  BP 118/71   Pulse (!) 109   Temp 97.9 F (36.6 C)   Ht '5\' 3"'$  (1.6 m)   Wt 184 lb 9.6 oz (83.7 kg)   SpO2 99%   BMI 32.70 kg/m   BP Readings from Last 3 Encounters:  08/21/22 118/71  05/15/21 125/81   05/03/21 114/67    Wt Readings from Last 3 Encounters:  08/21/22 184 lb 9.6 oz (83.7 kg)  05/15/21 173 lb 12.8 oz (78.8 kg)  05/03/21 175 lb (79.4 kg)     Physical Exam Constitutional:      General: She is not in acute distress.    Appearance: She is well-developed.  HENT:     Head: Normocephalic and atraumatic.  Eyes:     Conjunctiva/sclera: Conjunctivae normal.     Pupils: Pupils are equal, round, and reactive to light.  Neck:     Thyroid: No thyromegaly.  Cardiovascular:     Rate and Rhythm: Normal rate and regular rhythm.     Heart sounds: Normal heart sounds. No murmur heard. Pulmonary:     Effort: Pulmonary effort is normal. No respiratory distress.     Breath sounds: Normal breath sounds. No wheezing or rales.  Abdominal:     General: Bowel sounds are normal. There is no distension.     Palpations: Abdomen is soft.     Tenderness: There is no abdominal tenderness.  Musculoskeletal:        General: Normal range of motion.     Cervical back: Normal range of motion and neck supple.  Lymphadenopathy:     Cervical: No cervical adenopathy.  Skin:    General: Skin  is warm and dry.  Neurological:     Mental Status: She is alert and oriented to person, place, and time.  Psychiatric:        Behavior: Behavior normal.        Thought Content: Thought content normal.        Judgment: Judgment normal.       Assessment & Plan:   Jeanne Frederick was seen today for employment physical.  Diagnoses and all orders for this visit:  History of anaphylaxis  Other orders -     EPINEPHrine 0.3 mg/0.3 mL IJ SOAJ injection; Inject 0.3 mg into the muscle as needed for anaphylaxis.       I have discontinued Jeanne Frederick Stauffer's EPINEPHrine, ondansetron, and sulfamethoxazole-trimethoprim. I am also having her start on EPINEPHrine. Additionally, I am having her maintain her colestipol and Junel 1.5/30.  Allergies as of 08/21/2022       Reactions   Milk (cow) Swelling, Other (See  Comments)   Beef-derived Products    Alpha Gal Allergy   Other Other (See Comments)   Cannot take capsules of any kind d/t alpha gal disease   Pork-derived Products    Alpha Gal Allergy        Medication List        Accurate as of August 21, 2022 11:59 PM. If you have any questions, ask your nurse or doctor.          STOP taking these medications    ondansetron 8 MG disintegrating tablet Commonly known as: ZOFRAN-ODT Stopped by: Claretta Fraise, MD   sulfamethoxazole-trimethoprim 800-160 MG tablet Commonly known as: BACTRIM DS Stopped by: Claretta Fraise, MD       TAKE these medications    colestipol 1 g tablet Commonly known as: COLESTID Take 2 g by mouth daily.   EPINEPHrine 0.3 mg/0.3 mL Soaj injection Commonly known as: EPI-PEN Inject 0.3 mg into the muscle as needed for anaphylaxis. What changed:  medication strength how much to take Changed by: Claretta Fraise, MD   Lenda Kelp 1.5/30 1.5-30 MG-MCG tablet Generic drug: Norethindrone Acetate-Ethinyl Estradiol Take 1 tablet by mouth daily.         Follow-up: Return in about 1 year (around 08/22/2023), or if symptoms worsen or fail to improve.  Claretta Fraise, M.D.

## 2022-08-25 ENCOUNTER — Ambulatory Visit: Payer: No Typology Code available for payment source

## 2022-08-25 ENCOUNTER — Other Ambulatory Visit: Payer: No Typology Code available for payment source

## 2022-08-25 DIAGNOSIS — Z111 Encounter for screening for respiratory tuberculosis: Secondary | ICD-10-CM

## 2022-08-25 NOTE — Addendum Note (Signed)
Addended by: Baldomero Lamy B on: 08/25/2022 04:14 PM   Modules accepted: Orders

## 2022-08-27 LAB — QUANTIFERON-TB GOLD PLUS
QuantiFERON Mitogen Value: >10 IU/mL
QuantiFERON Nil Value: 0 IU/mL
QuantiFERON TB1 Ag Value: 0 IU/mL
QuantiFERON TB2 Ag Value: 0 IU/mL
QuantiFERON-TB Gold Plus: NEGATIVE

## 2022-09-16 NOTE — Progress Notes (Unsigned)
This encounter was created in error - please disregard.

## 2022-09-22 ENCOUNTER — Ambulatory Visit: Payer: No Typology Code available for payment source

## 2023-02-17 ENCOUNTER — Encounter: Payer: Self-pay | Admitting: Podiatry

## 2023-02-17 ENCOUNTER — Other Ambulatory Visit: Payer: Self-pay | Admitting: Podiatry

## 2023-02-17 ENCOUNTER — Ambulatory Visit: Payer: BC Managed Care – PPO | Admitting: Podiatry

## 2023-02-17 ENCOUNTER — Ambulatory Visit (INDEPENDENT_AMBULATORY_CARE_PROVIDER_SITE_OTHER): Payer: BC Managed Care – PPO

## 2023-02-17 DIAGNOSIS — S9031XA Contusion of right foot, initial encounter: Secondary | ICD-10-CM

## 2023-02-17 DIAGNOSIS — M7750 Other enthesopathy of unspecified foot: Secondary | ICD-10-CM

## 2023-02-17 MED ORDER — METHYLPREDNISOLONE 4 MG PO TBPK
ORAL_TABLET | ORAL | 0 refills | Status: DC
Start: 2023-02-17 — End: 2023-08-26

## 2023-02-17 MED ORDER — MELOXICAM 15 MG PO TABS
15.0000 mg | ORAL_TABLET | Freq: Every day | ORAL | 3 refills | Status: DC
Start: 2023-02-17 — End: 2023-08-26

## 2023-02-18 NOTE — Progress Notes (Signed)
She presents today after having not seen her for a couple of years states that a donkey stepped on her foot couple of weeks ago and it is still sore.  Objective: Vital signs are stable she is alert and oriented x 3.  Pulses are palpable.  There is no ecchymosis.  She did demonstrate a picture to me where he did demonstrate ecchymosis along the dorsal distal aspect of the foot by the toes particularly #3 #4 of the right foot.  She has full range of motion of all of her joints muscles tendons.  The majority of her pain is located over the third metatarsal mid diaphyseal region.  Radiographs taken today do not demonstrate any acute findings other than soft tissue swelling.  Assessment: Contusion bone.  Plan: Placed her in a Darco shoe started on methylprednisolone to be followed by meloxicam.  Like to follow-up with her in 2 weeks if not resolving.

## 2023-04-28 ENCOUNTER — Ambulatory Visit: Payer: BC Managed Care – PPO | Admitting: Podiatry

## 2023-08-26 ENCOUNTER — Encounter: Payer: Self-pay | Admitting: Family Medicine

## 2023-08-26 ENCOUNTER — Ambulatory Visit: Payer: BC Managed Care – PPO | Admitting: Family Medicine

## 2023-08-26 VITALS — BP 112/74 | HR 97 | Temp 97.7°F | Ht 63.0 in | Wt 194.0 lb

## 2023-08-26 DIAGNOSIS — K602 Anal fissure, unspecified: Secondary | ICD-10-CM | POA: Diagnosis not present

## 2023-08-26 NOTE — Progress Notes (Signed)
Chief Complaint  Patient presents with   Skin Tag   Hemorrhoids    HPI  Patient presents today for 2-3 weeks of rectal pain and bleeding. Feels like she is tearing with each BM.   PMH: Smoking status noted ROS: Per HPI  Objective: BP 112/74   Pulse 97   Temp 97.7 F (36.5 C)   Ht 5\' 3"  (1.6 m)   Wt 194 lb (88 kg)   SpO2 97%   BMI 34.37 kg/m  Gen: NAD, alert, cooperative with exam HEENT: NCAT, EOMI, PERRL Anus: 6 mm skin tag, benign appearing at 10:00 position. 4 mm anal fissure at 4:00 position. Tender to palpation. No erythema, edema or purulence. Not fluctuant.  Ext: No edema, warm Neuro: Alert and oriented, No gross deficits  Assessment and plan:  1. Anal fissure     Use Sitz bath after each BM, clean with witch hazel and apply neosporin BID & after Bms. S&S infection reviewed  Follow up as needed.  Mechele Claude, MD

## 2023-08-26 NOTE — Patient Instructions (Signed)
Anal Fissure, Adult  An anal fissure is a small tear or crack in the tissue near the opening of the butt (anus). In most cases, bleeding from the tear or crack stops on its own within a few minutes. You may have bleeding each time you poop until the tear or crack heals. What are the causes? Passing a large or hard poop (stool). Having trouble pooping (constipation). Having watery poops (diarrhea). An inflammatory bowel disease, like Crohn's disease or ulcerative colitis. Childbirth. Infections. Anal sex. What are the signs or symptoms? Bleeding from the butt. Small amounts of blood on your poop. The blood coats the outside of the poop. It is not mixed with the poop. Small amounts of blood on the toilet paper or in the toilet after you poop. Pain when you poop. Itching or irritation around your butt. How is this treated? Treatment may include: Making changes to what you eat and drink. This can help if you have trouble pooping. Taking fiber supplements. These can help make your poop soft. Taking warm water baths (sitz baths). These can help heal the tear. Using creams and ointments. If other treatments do not work, you may need: A shot near the tear or crack (botulinum injection). Surgery to fix the tear or crack. Follow these instructions at home: Medicines Take over-the-counter and prescription medicines only as told by your doctor. This includes creams and ointments that have medicine in them. Use medicines to make your poop soft as told by your doctor. Treating constipation You may need to take these actions to prevent or treat trouble pooping: Drink enough fluid to keep your pee (urine) pale yellow. Eat foods that are high in fiber. These include beans, whole grains, and fresh fruits and vegetables. Stay away from unripe bananas. Ripe bananas are a good choice. Limit foods that are high in fat and sugar. These include fried or sweet foods. Avoid dairy products. This includes  milk.  General instructions  Keep the butt area clean and dry. Take a warm water bath as told by your doctor. Do not use soap. Contact a doctor if: You have more bleeding. You have a fever. You have watery poop that is mixed with blood. Your pain does not go away. Your problems get worse. This information is not intended to replace advice given to you by your health care provider. Make sure you discuss any questions you have with your health care provider. Document Revised: 10/09/2022 Document Reviewed: 10/09/2022 Elsevier Patient Education  2024 [employer].

## 2023-09-02 ENCOUNTER — Ambulatory Visit: Payer: BC Managed Care – PPO | Admitting: Family Medicine

## 2023-10-14 ENCOUNTER — Encounter (HOSPITAL_BASED_OUTPATIENT_CLINIC_OR_DEPARTMENT_OTHER): Payer: Self-pay

## 2023-10-14 ENCOUNTER — Emergency Department (HOSPITAL_BASED_OUTPATIENT_CLINIC_OR_DEPARTMENT_OTHER)
Admission: EM | Admit: 2023-10-14 | Discharge: 2023-10-14 | Disposition: A | Discharge status: Home / Self Care | Payer: 59 | Attending: Emergency Medicine | Admitting: Emergency Medicine

## 2023-10-14 ENCOUNTER — Other Ambulatory Visit: Payer: Self-pay

## 2023-10-14 DIAGNOSIS — J0100 Acute maxillary sinusitis, unspecified: Secondary | ICD-10-CM | POA: Diagnosis not present

## 2023-10-14 DIAGNOSIS — R0981 Nasal congestion: Secondary | ICD-10-CM

## 2023-10-14 DIAGNOSIS — R059 Cough, unspecified: Secondary | ICD-10-CM

## 2023-10-14 DIAGNOSIS — Z20822 Contact with and (suspected) exposure to covid-19: Secondary | ICD-10-CM | POA: Diagnosis not present

## 2023-10-14 LAB — RESP PANEL BY RT-PCR (RSV, FLU A&B, COVID)  RVPGX2
Influenza A by PCR: NEGATIVE
Influenza B by PCR: NEGATIVE
Resp Syncytial Virus by PCR: NEGATIVE
SARS Coronavirus 2 by RT PCR: NEGATIVE

## 2023-10-14 MED ORDER — CETIRIZINE-PSEUDOEPHEDRINE ER 5-120 MG PO TB12
1.0000 | ORAL_TABLET | Freq: Every day | ORAL | 0 refills | Status: AC | PRN
Start: 2023-10-14 — End: ?

## 2023-10-14 MED ORDER — AMOXICILLIN-POT CLAVULANATE 875-125 MG PO TABS: 1.0000 | ORAL_TABLET | Freq: Two times a day (BID) | ORAL | 0 refills | Status: DC

## 2023-10-14 MED ORDER — TRIAMCINOLONE ACETONIDE 55 MCG/ACT NA AERO: 2.0000 | INHALATION_SPRAY | Freq: Every day | NASAL | 12 refills | Status: DC

## 2023-10-14 MED ORDER — BENZONATATE 100 MG PO CAPS: 100.0000 mg | ORAL_CAPSULE | Freq: Three times a day (TID) | ORAL | 0 refills | Status: DC | PRN

## 2023-10-14 NOTE — ED Triage Notes (Signed)
 Pt states she was treated for a sinus infection with steroids only, finished yesterday.  Now has developed a cough and congestion

## 2023-10-14 NOTE — ED Provider Notes (Signed)
 [place] EMERGENCY DEPARTMENT AT [facility] Provider Note   CSN: [id number] Arrival date & time: 10/14/23  9366     History  Chief Complaint  Patient presents with   Cough    Jeanne Frederick is a 28 y.o. female.   Cough   28 year old female presents emergency department complaints of nasal congestion, sinus pressure, cough.  Patient states that she has been ill with symptoms since around Christmas eve.  Was seen at urgent care on Thursday of last week and placed on steroids for sinus infection inpatient has not noticed improvement.  Reports that symptoms.  Has tried over-the-counter cough and cold medicine without significant improvement.  Denies any fever, chest pain, shortness of breath, abdominal pain, nausea, vomiting.  Presents emergency department for repeat assessment.  Past medical history significant for GERD, kidney stone, allergic rhinitis  Home Medications Prior to Admission medications   Medication Sig Start Date End Date Taking? Authorizing Provider  benzonatate  (TESSALON ) 100 MG capsule Take 1 capsule (100 mg total) by mouth 3 (three) times daily as needed. 10/14/23  Yes Silver Fell A, PA  cetirizine -pseudoephedrine  (ZYRTEC -D) 5-120 MG tablet Take 1 tablet by mouth daily as needed for allergies. 10/14/23  Yes Silver Fell A, PA  triamcinolone  (NASACORT ) 55 MCG/ACT AERO nasal inhaler Place 2 sprays into the nose daily. 10/14/23  Yes Silver Fell A, PA  amoxicillin -clavulanate (AUGMENTIN ) 875-125 MG tablet Take 1 tablet by mouth every 12 (twelve) hours. 10/14/23   Silver Fell LABOR, PA  colestipol (COLESTID) 1 g tablet Take 2 g by mouth daily. 06/19/20   [provider]  EPINEPHrine  0.3 mg/0.3 mL IJ SOAJ injection Inject 0.3 mg into the muscle as needed for anaphylaxis. 08/24/22   Zollie Lowers, MD  JUNEL 1.5/30 1.5-30 MG-MCG tablet Take 1 tablet by mouth daily. 03/29/21   [provider]      Allergies    Milk (cow), Beef-derived drug  products, Other, and Pork-derived products    Review of Systems   Review of Systems  Respiratory:  Positive for cough.   All other systems reviewed and are negative.   Physical Exam Updated Vital Signs BP 121/86   Pulse 94   Temp 98.4 F (36.9 C) (Oral)   Resp 18   Ht 5' 3 (1.6 m)   Wt 86.2 kg   LMP 09/13/2023 (Exact Date)   SpO2 100%   BMI 33.66 kg/m  Physical Exam Vitals and nursing note reviewed.  Constitutional:      General: She is not in acute distress.    Appearance: She is well-developed.  HENT:     Head: Normocephalic and atraumatic.     Comments: Maxillary sinus tender to palpation.    Nose: Congestion and rhinorrhea present.     Mouth/Throat:     Mouth: Mucous membranes are moist.     Pharynx: Oropharynx is clear. No oropharyngeal exudate or posterior oropharyngeal erythema.  Eyes:     Conjunctiva/sclera: Conjunctivae normal.  Cardiovascular:     Rate and Rhythm: Normal rate and regular rhythm.     Heart sounds: No murmur heard. Pulmonary:     Effort: Pulmonary effort is normal. No respiratory distress.     Breath sounds: Normal breath sounds. No wheezing, rhonchi or rales.  Abdominal:     Palpations: Abdomen is soft.     Tenderness: There is no abdominal tenderness.  Musculoskeletal:        General: No swelling.     Cervical back: Neck supple.  Skin:    General: Skin is warm and dry.     Capillary Refill: Capillary refill takes less than 2 seconds.  Neurological:     Mental Status: She is alert.  Psychiatric:        Mood and Affect: Mood normal.     ED Results / Procedures / Treatments   Labs (all labs ordered are listed, but only abnormal results are displayed) Labs Reviewed  RESP PANEL BY RT-PCR (RSV, FLU A&B, COVID)  RVPGX2    EKG None  Radiology No results found.  Procedures Procedures    Medications Ordered in ED Medications - No data to display  ED Course/ Medical Decision Making/ A&P                                  Medical Decision Making Risk OTC drugs. Prescription drug management.   This patient presents to the ED for concern of cough, sinus pressure, nasal congestion, this involves an extensive number of treatment options, and is a complaint that carries with it a high risk of complications and morbidity.  The differential diagnosis includes viral URI, COVID, flu, RSV, pneumonia, sinusitis, other   Co morbidities that complicate the patient evaluation  See HPI   Additional history obtained:  Additional history obtained from EMR External records from outside source obtained and reviewed including hospital records   Lab Tests:  I Ordered, and personally interpreted labs.  The pertinent results include: Respiratory viral panel negative   Imaging Studies ordered:  N/a   Cardiac Monitoring: / EKG:  The patient was maintained on a cardiac monitor.  I personally viewed and interpreted the cardiac monitored which showed an underlying rhythm of: sinus rhythm   Consultations Obtained:  N/a   Problem List / ED Course / Critical interventions / Medication management  Sinusitis, cough, nasal congestion Reevaluation of the patient showed that the patient stayed the same I have reviewed the patients home medicines and have made adjustments as needed   Social Determinants of Health:  Denies tobacco, illicit drug use   Test / Admission - Considered:  Sinusitis, cough, nasal congestion Vitals signs significant for slight tachycardia with a heart rate of 105 which decreased to 94 with time labs in the ED. Otherwise within normal range and stable throughout visit. Laboratory studies significant for: See above 28 year old female presents emergency department with now near 2 weeks of cough, nasal congestion, sinus pressure.  On exam, maxillary sinus tenderness to palpation, nasal congestion/rhinorrhea present.  Lungs clear to auscultation bilaterally.  Given duration of symptoms,  offered patient chest x-ray imaging but she declined.  Discussion was had with patient regarding empiric treatment for bacterial sinusitis and similar antimicrobial coverage for CAP of which she agreed and still declined chest x-ray imaging.  Patient's viral testing negative.  Will treat empirically for bacterial cause of sinusitis as well as recommend additional symptomatic therapy as described in AVS.  Close follow-up with PCP recommended for reevaluation.  Treatment plan discussed at length with patient and she acknowledged understanding was agreeable to said plan.  Patient overall well-appearing, afebrile in no acute distress, nonhypoxic. Worrisome signs and symptoms were discussed with the patient, and the patient acknowledged understanding to return to the ED if noticed. Patient was stable upon discharge.          Final Clinical Impression(s) / ED Diagnoses Final diagnoses:  Acute non-recurrent maxillary sinusitis  Cough, unspecified type  Nasal congestion    Rx / DC Orders      Silver Wonda LABOR, PA 10/14/23 9060    Lenor Hollering, MD 10/14/23 [phone]

## 2023-10-14 NOTE — Discharge Instructions (Addendum)
 As discussed, you tested negative for COVID, flu, RSV.  Given concern for bacterial sinus infection, will place you on antibiotics in the form of Augmentin .  Please take as directed.  Also recommend additionally beginning allergy medicine such as Claritin/Zyrtec Blair as well as nasal steroid spray such as Nasacort /Flonase .  Will also send you home with a cough suppressant to use as needed.  Recommend follow-up with your primary care for reassessment.  Please not hesitate to return if the worrisome signs and symptoms we discussed become apparent.

## 2023-11-16 ENCOUNTER — Telehealth: Payer: 59 | Admitting: Physician Assistant

## 2023-11-16 ENCOUNTER — Ambulatory Visit: Payer: Self-pay | Admitting: Family Medicine

## 2023-11-16 DIAGNOSIS — R6889 Other general symptoms and signs: Secondary | ICD-10-CM

## 2023-11-16 MED ORDER — BENZONATATE 100 MG PO CAPS: 100.0000 mg | ORAL_CAPSULE | Freq: Three times a day (TID) | ORAL | 0 refills | Status: DC | PRN

## 2023-11-16 MED ORDER — OSELTAMIVIR PHOSPHATE 75 MG PO CAPS: 75.0000 mg | ORAL_CAPSULE | Freq: Two times a day (BID) | ORAL | 0 refills | Status: DC

## 2023-11-16 NOTE — Telephone Encounter (Signed)
 Copied from CRM [id number]. Topic: Clinical - Red Word Triage >> Nov 16, 2023  1:50 PM Elle L wrote: Red Word that prompted transfer to Nurse Triage: The patient is having body aches and pain, coughing, and a 100.4 fever that won't break and started last night.   Chief Complaint: dry cough Symptoms: body aches Frequency: ongoing since yesterday  Pertinent Negatives: Patient denies congestion  Disposition: [] ED /[] Urgent Care (no appt availability in office) / [x] Appointment(In office/virtual)/ []  [facility]/ [] Home Care/ [] Refused Recommended Disposition /[] [facility]/ []  Follow-up with PCP Additional Notes: The patient reported a dry cough, body aches that are "really bad" and a temperature of 100.6.  She denied congestion.  She requested an appointment for further assessment and a prescription as she had to miss work today due to symptoms.  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  Scheduled her for a next day appointment for further evaluation.  Reason for Disposition  [1] Continuous (nonstop) coughing interferes with work or school AND [2] no improvement using cough treatment per Care Advice  Answer Assessment - Initial Assessment Questions 1. ONSET: "When did the cough begin?"      yesterday 2. SEVERITY: "How bad is the cough today?"      Comes and goes 3. SPUTUM: "Describe the color of your sputum" (none, dry cough; clear, white, yellow, green)     Very little when in the hot shower clear 4. HEMOPTYSIS: "Are you coughing up any blood?" If so ask: "How much?" (flecks, streaks, tablespoons, etc.)     none 5. DIFFICULTY BREATHING: "Are you having difficulty breathing?" If Yes, ask: "How bad is it?" (e.g., mild, moderate, severe)    - MILD: No SOB at rest, mild SOB with walking, speaks normally in sentences, can lie down, no retractions, pulse < 100.    - MODERATE: SOB at rest, SOB with minimal exertion and prefers to sit, cannot lie down  flat, speaks in phrases, mild retractions, audible wheezing, pulse 100-120.    - SEVERE: Very SOB at rest, speaks in single words, struggling to breathe, sitting hunched forward, retractions, pulse > 120      None  6. FEVER: "Do you have a fever?" If Yes, ask: "What is your temperature, how was it measured, and when did it start?"     100.6 7. CARDIAC HISTORY: "Do you have any history of heart disease?" (e.g., heart attack, congestive heart failure)      None  8. LUNG HISTORY: "Do you have any history of lung disease?"  (e.g., pulmonary embolus, asthma, emphysema)     none 10. OTHER SYMPTOMS: "Do you have any other symptoms?" (e.g., runny nose, wheezing, chest pain)       Body aches, eyes watery 11. PREGNANCY: "Is there any chance you are pregnant?" "When was your last menstrual period?"       No  12. 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

## 2023-11-16 NOTE — Progress Notes (Signed)
 E visit for Flu like symptoms   We are sorry that you are not feeling well.  Here is how we plan to help! Based on what you have shared with me it looks like you may have a respiratory virus that may be influenza.  Influenza or "the flu" is   an infection caused by a respiratory virus. The flu virus is highly contagious and persons who did not receive their yearly flu vaccination may "catch" the flu from close contact.  We have anti-viral medications to treat the viruses that cause this infection. They are not a "cure" and only shorten the course of the infection. These prescriptions are most effective when they are given within the first 2 days of "flu" symptoms. Antiviral medication are indicated if you have a high risk of complications from the flu. You should  also consider an antiviral medication if you are in close contact with someone who is at risk. These medications can help patients avoid complications from the flu  but have side effects that you should know. Possible side effects from Tamiflu or oseltamivir include nausea, vomiting, diarrhea, dizziness, headaches, eye redness, sleep problems or other respiratory symptoms. You should not take Tamiflu if you have an allergy to oseltamivir or any to the ingredients in Tamiflu.  Based upon your symptoms and potential risk factors I have prescribed Oseltamivir (Tamiflu).  It has been sent to your designated pharmacy.  You will take one 75 mg capsule orally twice a day for the next 5 days. I have also prescribed Tessalon perles 100mg  Take 1-2 capsules every 8 hours as needed for cough.  ANYONE WHO HAS FLU SYMPTOMS SHOULD: Stay home. The flu is highly contagious and going out or to work exposes others! Be sure to drink plenty of fluids. Water is fine as well as fruit juices, sodas and electrolyte beverages. You may want to stay away from caffeine or alcohol. If you are nauseated, try taking small sips of liquids. How do you know if you are getting  enough fluid? Your urine should be a pale yellow or almost colorless. Get rest. Taking a steamy shower or using a humidifier may help nasal congestion and ease sore throat pain. Using a saline nasal spray works much the same way. Cough drops, hard candies and sore throat lozenges may ease your cough. Line up a caregiver. Have someone check on you regularly.   GET HELP RIGHT AWAY IF: You cannot keep down liquids or your medications. You become short of breath Your fell like you are going to pass out or loose consciousness. Your symptoms persist after you have completed your treatment plan MAKE SURE YOU  Understand these instructions. Will watch your condition. Will get help right away if you are not doing well or get worse.  Your e-visit answers were reviewed by a board certified advanced clinical practitioner to complete your personal care plan.  Depending on the condition, your plan could have included both over the counter or prescription medications.  If there is a problem please reply  once you have received a response from your provider.  Your safety is important to [place].  If you have drug allergies check your prescription carefully.    You can use MyChart to ask questions about today's visit, request a non-urgent call back, or ask for a work or school excuse for 24 hours related to this e-Visit. If it has been greater than 24 hours you will need to follow up with your provider, or  enter a new e-Visit to address those concerns.  You will get an e-mail in the next two days asking about your experience.  I hope that your e-visit has been valuable and will speed your recovery. Thank you for using e-visits.   I have spent 5 minutes in review of e-visit questionnaire, review and updating patient chart, medical decision making and response to patient.   Margaretann Loveless, PA-C

## 2023-11-17 ENCOUNTER — Ambulatory Visit: Payer: Self-pay | Admitting: Family Medicine

## 2023-11-17 ENCOUNTER — Encounter: Payer: Self-pay | Admitting: Family Medicine

## 2024-02-11 ENCOUNTER — Ambulatory Visit: Admitting: Podiatry

## 2024-02-11 DIAGNOSIS — M7752 Other enthesopathy of left foot: Secondary | ICD-10-CM

## 2024-02-11 DIAGNOSIS — G5782 Other specified mononeuropathies of left lower limb: Secondary | ICD-10-CM

## 2024-02-11 MED ORDER — TRIAMCINOLONE ACETONIDE 40 MG/ML IJ SUSP
20.0000 mg | Freq: Once | INTRAMUSCULAR | Status: AC
  Administered 2024-02-11: 20 mg

## 2024-02-15 NOTE — Progress Notes (Signed)
 She presents today chief complaint of pain to the third interspace of the left foot.  Particularly 3rd and 4th toes she is also complaining of pain to the first toe on the left foot.  Objective: Vital signs are stable alert oriented x 3.  Pulses are palpable.  Palpable Mulder's click third interdigital space of the left foot.  She has tenderness on end range of motion of the first metatarsal phalangeal joint of the left foot consistent with osteoarthritic joint space narrowing and spurring.  Assessment: Neuroma third interspace.  Capsulitis first metatarsophalangeal joint.  Plan: Discussed etiology pathology and surgical therapies injected the third interdigital space today with Kenalog  and local anesthetic tolerated procedure well without complications.  Was given both oral rehab instructions for care of the foot and I will follow-up with her in a few weeks.

## 2024-03-07 ENCOUNTER — Other Ambulatory Visit (HOSPITAL_COMMUNITY): Payer: Self-pay

## 2024-03-07 MED ORDER — PLENVU 140 G PO SOLR
ORAL | 0 refills | Status: AC
  Filled 2024-03-07: qty 3, 1d supply, fill #0

## 2024-03-07 MED ORDER — BISACODYL 5 MG PO TBEC
DELAYED_RELEASE_TABLET | ORAL | 0 refills | Status: AC
  Filled 2024-03-07: qty 4, 1d supply, fill #0

## 2024-04-15 LAB — HM COLONOSCOPY

## 2024-06-14 ENCOUNTER — Ambulatory Visit: Admitting: Podiatry

## 2024-06-16 ENCOUNTER — Ambulatory Visit: Admitting: Podiatry

## 2024-06-30 ENCOUNTER — Encounter: Payer: Self-pay | Admitting: Podiatry

## 2024-06-30 ENCOUNTER — Ambulatory Visit: Admitting: Podiatry

## 2024-06-30 DIAGNOSIS — G5782 Other specified mononeuropathies of left lower limb: Secondary | ICD-10-CM | POA: Diagnosis not present

## 2024-07-04 ENCOUNTER — Encounter: Payer: Self-pay | Admitting: Gastroenterology

## 2024-07-04 NOTE — Progress Notes (Signed)
 She presents today states that her toes ache a lot particular around the plantar forefoot there are calluses also and around the left hallux  Objective: Vital signs are stable alert oriented x 3.  Pulses are palpable.  She has pain on palpation to the third interdigital space of the left foot with a palpable Mulder's click.  Assessment: Neuroma third interdigital space.  Plan: Injected 2 cc of dehydrated alcohol to the third interdigital space of the left foot.  Follow-up with her in 3 weeks for second injection.

## 2024-07-21 ENCOUNTER — Ambulatory Visit: Admitting: Podiatry

## 2024-09-28 ENCOUNTER — Telehealth: Admitting: Physician Assistant

## 2024-09-28 DIAGNOSIS — J029 Acute pharyngitis, unspecified: Secondary | ICD-10-CM | POA: Diagnosis not present

## 2024-09-28 MED ORDER — BENZONATATE 100 MG PO CAPS: 100.0000 mg | ORAL_CAPSULE | Freq: Three times a day (TID) | ORAL | 0 refills | Status: AC | PRN

## 2024-09-28 MED ORDER — LIDOCAINE VISCOUS HCL 2 % MT SOLN: 5.0000 mL | Freq: Four times a day (QID) | OROMUCOSAL | 0 refills | Status: AC | PRN

## 2024-09-28 NOTE — Progress Notes (Signed)
 We are sorry that you are not feeling well.  Here is how we plan to help!  Your symptoms indicate a likely viral infection (Pharyngitis).   Pharyngitis is inflammation in the back of the throat which can cause a sore throat, scratchiness and sometimes difficulty swallowing.   Pharyngitis is typically caused by a respiratory virus and will just run its course.  Please keep in mind that your symptoms could last up to 10 days.    For throat pain, we recommend over the counter oral pain relief medications such as acetaminophen  or aspirin, or anti-inflammatory medications such as ibuprofen or naproxen sodium.  Topical treatments such as oral throat lozenges or sprays may be used as needed. For throat pain, I have prescribed a Viscous Lidocaine  2% solution. Swallow 5-10 mL every 4-6 hours as needed for sore throat. DO NOT eat or drink anything for 15-20 minutes after swallowing to allow the medication to coat the throat. I have prescribed Tessalon  perles 100mg  Take 1-2 capsules every 8 hours as needed for cough.   Avoid close contact with loved ones, especially the very young and elderly.  Remember to wash your hands thoroughly throughout the day as this is the number one way to prevent the spread of infection! We also recommend that you periodically wipe down door knobs and counters with disinfectant.  After careful review of your answers, I would not recommend and antibiotic for your condition.  Antibiotics should not be used to treat conditions that we suspect are caused by viruses like the virus that causes the common cold or flu.  Providers prescribe antibiotics to treat infections caused by bacteria. Antibiotics are very powerful in treating bacterial infections when they are used properly.  To maintain their effectiveness, they should be used only when necessary.  Overuse of antibiotics has resulted in the development of super bugs that are resistant to treatment!    Some people with strep throat,  however, can have atypical symptoms. As such, if anything continued to progress despite treatment recommendations, you may need formal testing in clinic or office.  Home Care: Only take medications as instructed by your medical team. Do not drink alcohol while taking these medications. A steam or ultrasonic humidifier can help congestion.  You can place a towel over your head and breathe in the steam from hot water coming from a faucet. Avoid close contacts especially the very young and the elderly. Cover your mouth when you cough or sneeze. Always remember to wash your hands.  Get Help Right Away If: You develop worsening fever or throat pain. You develop a severe head ache or visual changes. Your symptoms persist after you have completed your treatment plan.  Make sure you Understand these instructions. Will watch your condition. Will get help right away if you are not doing well or get worse.  Your e-visit answers were reviewed by a board certified advanced clinical practitioner to complete your personal care plan.  Depending on the condition, your plan could have included both over the counter or prescription medications.  If there is a problem please reply once you have received a response from your provider.  Your safety is important to us .  If you have drug allergies check your prescription carefully.    You can use MyChart to ask questions about todays visit, request a non-urgent call back, or ask for a work or school excuse for 24 hours related to this e-Visit. If it has been greater than 24 hours you will  need to follow up with your provider, or enter a new e-Visit to address those concerns.  You will get an e-mail in the next two days asking about your experience.  I hope that your e-visit has been valuable and will speed your recovery. Thank you for using e-visits.   I have spent 5 minutes in review of e-visit questionnaire, review and updating patient chart, medical  decision making and response to patient.   Delon CHRISTELLA Dickinson, PA-C

## 2024-09-30 ENCOUNTER — Telehealth: Admitting: Family Medicine

## 2024-09-30 DIAGNOSIS — R059 Cough, unspecified: Secondary | ICD-10-CM

## 2024-09-30 NOTE — Progress Notes (Signed)
" ° °  Thank you for the details you included in the comment boxes. Those details are very helpful in determining the best course of treatment for you and help us  to provide the best care.Because of your symptoms and because of your multiple allergies, we recommend that you schedule a Virtual Urgent Care video visit in order for the provider to better assess what is going on.  The provider will be able to give you a more accurate diagnosis and treatment plan if we can more freely discuss your symptoms and with the addition of a virtual examination.   If you change your visit to a video visit, we will bill your insurance (similar to an office visit) and you will not be charged for this e-Visit. You will be able to stay at home and speak with the first available [facility] advanced practice provider. The link to do a video visit is in the drop down Menu tab of your Welcome screen in MyChart.    I have spent 5 minutes in review of e-visit questionnaire, review and updating patient chart, medical decision making and response to patient.   Roosvelt Mater, PA-C     "
